# Patient Record
Sex: Female | Born: 1970 | Race: White | Hispanic: No | Marital: Single
Health system: Midwestern US, Community
[De-identification: ages and names within clinical notes are randomized; demographics above are authoritative.]

## PROBLEM LIST (undated history)

## (undated) ENCOUNTER — Emergency Department (HOSPITAL_COMMUNITY): Discharge status: Against Medical Advice | Payer: BC Managed Care – PPO

## (undated) ENCOUNTER — Emergency Department: Disposition: A | Discharge status: Home / Self Care | Payer: PRIVATE HEALTH INSURANCE

## (undated) DIAGNOSIS — M1611 Unilateral primary osteoarthritis, right hip: Secondary | ICD-10-CM

## (undated) DIAGNOSIS — M1612 Unilateral primary osteoarthritis, left hip: Secondary | ICD-10-CM

## (undated) DIAGNOSIS — Z5189 Encounter for other specified aftercare: Secondary | ICD-10-CM

## (undated) DIAGNOSIS — F419 Anxiety disorder, unspecified: Secondary | ICD-10-CM

## (undated) HISTORY — PX: HIP SURGERY: SHX245

## (undated) HISTORY — PX: COSMETIC SURGERY: SHX468

## (undated) HISTORY — PX: GALLBLADDER SURGERY: SHX652

## (undated) HISTORY — PX: LAPAROSCOPIC HYSTERECTOMY: SHX1926

## (undated) HISTORY — PX: BACK SURGERY: SHX140

## (undated) HISTORY — PX: CHOLECYSTECTOMY: SHX55

## (undated) HISTORY — DX: Encounter for other specified aftercare: Z51.89

## (undated) HISTORY — PX: TUBAL LIGATION: SHX77

---

## 2006-10-11 ENCOUNTER — Ambulatory Visit (HOSPITAL_COMMUNITY): Admission: RE | Admit: 2006-10-11 | Discharge: 2006-10-12 | Payer: Self-pay | Admitting: Orthopedic Surgery

## 2006-10-11 ENCOUNTER — Encounter (INDEPENDENT_AMBULATORY_CARE_PROVIDER_SITE_OTHER): Payer: Self-pay | Admitting: Specialist

## 2007-07-05 ENCOUNTER — Other Ambulatory Visit: Admission: RE | Admit: 2007-07-05 | Discharge: 2007-07-05 | Payer: Self-pay | Admitting: Obstetrics and Gynecology

## 2008-06-06 ENCOUNTER — Other Ambulatory Visit: Admission: RE | Admit: 2008-06-06 | Discharge: 2008-06-06 | Payer: Self-pay | Admitting: Obstetrics and Gynecology

## 2010-12-18 NOTE — Op Note (Signed)
NAMECOLETTA, LOCKNER                ACCOUNT NO.:  [id number]   MEDICAL RECORD NO.:  [id number]          PATIENT TYPE:  OIB   LOCATION:  1520                         FACILITY:  [facility]   PHYSICIAN:  Georges Lynch. 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   OPERATION:  Hemilaminectomy and microdiskectomy at L5-S1 on the right.   PROCEDURE:  Under general anesthesia, a routine orthopedic prep and  draping of the lower back was carried out.  The patient had 1 g of IV  Ancef preop.  Two needles were placed in the back for localization  purposes.  An x-ray was taken.  Once we identified the L5-S1 interspace,  an incision was made directly over the space, bleeders identified and  cauterized.  Self-retaining retractors were inserted.  I stripped the  muscle from the lamina and spinous process at L5-S1.  Another x-ray was  taken to verify the position.  We then carried out a hemilaminectomy L5-  S1 in the usual fashion.  Great care was taken to protect the dura.  At  this time, I identified the ligamentum flavum, removed the flavum at the  same time the microscope was being used.  We then inserted some  cottonoids and protected the dura with the D'Errico retractor.  We  cauterized the lateral recess veins.  We did a nice decompression of the  recess.  We identified a large herniated disk.  A cruciate incision was  made in the posterior longitudinal ligament.  I then utilized the  Epstein curettes and nerve hooks to remove the subligamentous disk, and  then we compressed the rest of the disk down in the space and completed  a  microdiskectomy.  Multiple passes into the disk space were carried  out.  I went through a meticulous search for any other subligamentous  disk material.  We utilized the nerve hook and the Epstein curettes and  the hockey-stick to dissect out the subligamentous disk material.  The  root now was extremely free.  We did a nice foraminotomy.  We were able  to easily move the root about as well as the dura.  We thoroughly  irrigated out the area.  I loosely applied some Thrombin-soaked Gelfoam.  The wound then was closed in layers in usual fashion except I left the  deep proximal part of the wound partially left open for drainage  purposes.  I injected 20 mL of 0.5% Marcaine into the wound site.  The  subcu was closed over with 0 Vicryl, the skin with metal staples, and a  sterile Neosporin dressing was applied.  The patient had 1 gram of IV  Ancef preop.  At the end of procedure, she  had 30 mg of Toradol IV.   SURGEON:  Dr. Darrelyn Hillock   ASSISTANT:  Dr. Marlowe Kays           ______________________________  Georges Lynch. Darrelyn Hillock, M.D.     RAG/MEDQ  D:  10/11/2006  T:  10/13/2006  Job:  [id number]

## 2011-02-09 ENCOUNTER — Other Ambulatory Visit (HOSPITAL_COMMUNITY)
Admission: RE | Admit: 2011-02-09 | Discharge: 2011-02-09 | Disposition: A | Discharge status: Home / Self Care | Payer: BC Managed Care – PPO | Source: Ambulatory Visit | Attending: Obstetrics & Gynecology | Admitting: Obstetrics & Gynecology

## 2011-02-09 ENCOUNTER — Other Ambulatory Visit: Payer: Self-pay | Admitting: Obstetrics & Gynecology

## 2011-02-09 DIAGNOSIS — Z01419 Encounter for gynecological examination (general) (routine) without abnormal findings: Secondary | ICD-10-CM | POA: Insufficient documentation

## 2015-12-19 ENCOUNTER — Encounter: Payer: Self-pay | Admitting: Obstetrics and Gynecology

## 2015-12-19 ENCOUNTER — Ambulatory Visit (INDEPENDENT_AMBULATORY_CARE_PROVIDER_SITE_OTHER): Payer: PRIVATE HEALTH INSURANCE | Admitting: Obstetrics and Gynecology

## 2015-12-19 VITALS — BP 110/60 | Ht 68.0 in | Wt 162.0 lb

## 2015-12-19 DIAGNOSIS — N946 Dysmenorrhea, unspecified: Secondary | ICD-10-CM | POA: Diagnosis not present

## 2015-12-19 DIAGNOSIS — N920 Excessive and frequent menstruation with regular cycle: Secondary | ICD-10-CM

## 2015-12-19 DIAGNOSIS — Z01818 Encounter for other preprocedural examination: Secondary | ICD-10-CM

## 2015-12-19 DIAGNOSIS — N938 Other specified abnormal uterine and vaginal bleeding: Secondary | ICD-10-CM

## 2015-12-19 DIAGNOSIS — M545 Low back pain: Secondary | ICD-10-CM

## 2015-12-19 NOTE — Progress Notes (Signed)
Patient ID: Haley Joseph, female   DOB: 01/19/1971, 45 y.o.   MRN: [id number] Pt here today for vaginal bleeding. Pt states that she has been bleeding 17 days. Has had an US and was told she had fibroids and needs an ablation.

## 2015-12-19 NOTE — Patient Instructions (Signed)
TANICA GAIGE  12/19/2015     @PREFPERIOPPHARMACY @   Your procedure is scheduled on  12/23/2015   Report to [facility] at  900  A.M.  Call this number if you have problems the morning of surgery:  [phone]   Remember:  Do not eat food or drink liquids after midnight.  Take these medicines the morning of surgery with A SIP OF WATER  none   Do not wear jewelry, make-up or nail polish.  Do not wear lotions, powders, or perfumes.  You may wear deodorant.  Do not shave 48 hours prior to surgery.  Men may shave face and neck.  Do not bring valuables to the hospital.  [facility] is not responsible for any belongings or valuables.  Contacts, dentures or bridgework may not be worn into surgery.  Leave your suitcase in the car.  After surgery it may be brought to your room.  For patients admitted to the hospital, discharge time will be determined by your treatment team.  Patients discharged the day of surgery will not be allowed to drive home.   Name and phone number of your driver:   family Special instructions:  none  Please read over the following fact sheets that you were given. Coughing and Deep Breathing, Surgical Site Infection Prevention, Anesthesia Post-op Instructions and Care and Recovery After Surgery      Hysteroscopy Hysteroscopy is a procedure used for looking inside the womb (uterus). It may be done for various reasons, including:  To evaluate abnormal bleeding, fibroid (benign, noncancerous) tumors, polyps, scar tissue (adhesions), and possibly cancer of the uterus.  To look for lumps (tumors) and other uterine growths.  To look for causes of why a woman cannot get pregnant (infertility), causes of recurrent loss of pregnancy (miscarriages), or a lost intrauterine device (IUD).  To perform a sterilization by blocking the fallopian tubes from inside the uterus. In this procedure, a thin, flexible tube with a tiny light and camera on  the end of it (hysteroscope) is used to look inside the uterus. A hysteroscopy should be done right after a menstrual period to be sure you are not pregnant. LET [facility] CARE PROVIDER KNOW ABOUT:   Any allergies you have.  All medicines you are taking, including vitamins, herbs, eye drops, creams, and over-the-counter medicines.  Previous problems you or members of your family have had with the use of anesthetics.  Any blood disorders you have.  Previous surgeries you have had.  Medical conditions you have. RISKS AND COMPLICATIONS  Generally, this is a safe procedure. However, as with any procedure, complications can occur. Possible complications include:  Putting a hole in the uterus.  Excessive bleeding.  Infection.  Damage to the cervix.  Injury to other organs.  Allergic reaction to medicines.  Too much fluid used in the uterus for the procedure. BEFORE THE PROCEDURE   Ask your health care provider about changing or stopping any regular medicines.  Do not take aspirin or blood thinners for 1 week before the procedure, or as directed by your health care provider. These can cause bleeding.  If you smoke, do not smoke for 2 weeks before the procedure.  In some cases, a medicine is placed in the cervix the day before the procedure. This medicine makes the cervix have a larger opening (dilate). This makes it easier for the instrument to be inserted into the uterus  during the procedure.  Do not eat or drink anything for at least 8 hours before the surgery.  Arrange for someone to take you home after the procedure. PROCEDURE   You may be given a medicine to relax you (sedative). You may also be given one of the following:  A medicine that numbs the area around the cervix (local anesthetic).  A medicine that makes you sleep through the procedure (general anesthetic).  The hysteroscope is inserted through the vagina into the uterus. The camera on the hysteroscope  sends a picture to a TV screen. This gives the surgeon a good view inside the uterus.  During the procedure, air or a liquid is put into the uterus, which allows the surgeon to see better.  Sometimes, tissue is gently scraped from inside the uterus. These tissue samples are sent to a lab for testing. AFTER THE PROCEDURE   If you had a general anesthetic, you may be groggy for a couple hours after the procedure.  If you had a local anesthetic, you will be able to go home as soon as you are stable and feel ready.  You may have some cramping. This normally lasts for a couple days.  You may have bleeding, which varies from light spotting for a few days to menstrual-like bleeding for 3-7 days. This is normal.  If your test results are not back during the visit, make an appointment with your health care provider to find out the results.   This information is not intended to replace advice given to you by your health care provider. Make sure you discuss any questions you have with your health care provider.   Document Released: 10/25/2000 Document Revised: 05/09/2013 Document Reviewed: 02/15/2013 Elsevier Interactive Patient Education 2016 [employer] Hysteroscopy, Care After Refer to this sheet in the next few weeks. These instructions provide you with information on caring for yourself after your procedure. Your health care provider may also give you more specific instructions. Your treatment has been planned according to current medical practices, but problems sometimes occur. Call your health care provider if you have any problems or questions after your procedure.  WHAT TO EXPECT AFTER THE PROCEDURE After your procedure, it is typical to have the following:  You may have some cramping. This normally lasts for a couple days.  You may have bleeding. This can vary from light spotting for a few days to menstrual-like bleeding for 3-7 days. HOME CARE INSTRUCTIONS  Rest for the first 1-2  days after the procedure.  Only take over-the-counter or prescription medicines as directed by your health care provider. Do not take aspirin. It can increase the chances of bleeding.  Take showers instead of baths for 2 weeks or as directed by your health care provider.  Do not drive for 24 hours or as directed.  Do not drink alcohol while taking pain medicine.  Do not use tampons, douche, or have sexual intercourse for 2 weeks or until your health care provider says it is okay.  Take your temperature twice a day for 4-5 days. Write it down each time.  Follow your health care provider's advice about diet, exercise, and lifting.  If you develop constipation, you may:  Take a mild laxative if your health care provider approves.  Add bran foods to your diet.  Drink enough fluids to keep your urine clear or pale yellow.  Try to have someone with you or available to you for the first 24-48 hours, especially if you  were given a general anesthetic.  Follow up with your health care provider as directed. SEEK MEDICAL CARE IF:  You feel dizzy or lightheaded.  You feel sick to your stomach (nauseous).  You have abnormal vaginal discharge.  You have a rash.  You have pain that is not controlled with medicine. SEEK IMMEDIATE MEDICAL CARE IF:  You have bleeding that is heavier than a normal menstrual period.  You have a fever.  You have increasing cramps or pain, not controlled with medicine.  You have new belly (abdominal) pain.  You pass out.  You have pain in the tops of your shoulders (shoulder strap areas).  You have shortness of breath.   This information is not intended to replace advice given to you by your health care provider. Make sure you discuss any questions you have with your health care provider.   Document Released: 05/09/2013 Document Reviewed: 05/09/2013 Elsevier Interactive Patient Education 2016 [employer]. Dilation and Curettage or Vacuum  Curettage Dilation and curettage (D&C) and vacuum curettage are minor procedures. A D&C involves stretching (dilation) the cervix and scraping (curettage) the inside lining of the womb (uterus). During a D&C, tissue is gently scraped from the inside lining of the uterus. During a vacuum curettage, the lining and tissue in the uterus are removed with the use of gentle suction.  Curettage may be performed to either diagnose or treat a problem. As a diagnostic procedure, curettage is performed to examine tissues from the uterus. A diagnostic curettage may be performed for the following symptoms:   Irregular bleeding in the uterus.   Bleeding with the development of clots.   Spotting between menstrual periods.   Prolonged menstrual periods.   Bleeding after menopause.   No menstrual period (amenorrhea).   A change in size and shape of the uterus.  As a treatment procedure, curettage may be performed for the following reasons:   Removal of an IUD (intrauterine device).   Removal of retained placenta after giving birth. Retained placenta can cause an infection or bleeding severe enough to require transfusions.   Abortion.   Miscarriage.   Removal of polyps inside the uterus.   Removal of uncommon types of noncancerous lumps (fibroids).  [facility] KNOW ABOUT:   Any allergies you have.   All medicines you are taking, including vitamins, herbs, eye drops, creams, and over-the-counter medicines.   Previous problems you or members of your family have had with the use of anesthetics.   Any blood disorders you have.   Previous surgeries you have had.   Medical conditions you have. RISKS AND COMPLICATIONS  Generally, this is a safe procedure. However, as with any procedure, complications can occur. Possible complications include:  Excessive bleeding.   Infection of the uterus.   Damage to the cervix.   Development of scar tissue  (adhesions) inside the uterus, later causing abnormal amounts of menstrual bleeding.   Complications from the general anesthetic, if a general anesthetic is used.   Putting a hole (perforation) in the uterus. This is rare.  BEFORE THE PROCEDURE   Eat and drink before the procedure only as directed by your health care provider.   Arrange for someone to take you home.  PROCEDURE  This procedure usually takes about 15-30 minutes.  You will be given one of the following:  A medicine that numbs the area in and around the cervix (local anesthetic).   A medicine to make you sleep through the procedure (general  anesthetic).  You will lie on your back with your legs in stirrups.   A warm metal or plastic instrument (speculum) will be placed in your vagina to keep it open and to allow the health care provider to see the cervix.  There are two ways in which your cervix can be softened and dilated. These include:   Taking a medicine.   Having thin rods (laminaria) inserted into your cervix.   A curved tool (curette) will be used to scrape cells from the inside lining of the uterus. In some cases, gentle suction is applied with the curette. The curette will then be removed.  AFTER THE PROCEDURE   You will rest in the recovery area until you are stable and are ready to go home.   You may feel sick to your stomach (nauseous) or throw up (vomit) if you were given a general anesthetic.   You may have a sore throat if a tube was placed in your throat during general anesthesia.   You may have light cramping and bleeding. This may last for 2 days to 2 weeks after the procedure.   Your uterus needs to make a new lining after the procedure. This may make your next period late.   This information is not intended to replace advice given to you by your health care provider. Make sure you discuss any questions you have with your health care provider.   Document Released: 07/19/2005  Document Revised: 03/21/2013 Document Reviewed: 02/15/2013 Elsevier Interactive Patient Education 2016 [employer]. Dilation and Curettage or Vacuum Curettage, Care After These instructions give you information on caring for yourself after your procedure. Your doctor may also give you more specific instructions. Call your doctor if you have any problems or questions after your procedure. HOME CARE  Do not drive for 24 hours.  Wait 1 week before doing any activities that wear you out.  Take your temperature 2 times a day for 4 days. Write it down. Tell your doctor if you have a fever.  Do not stand for a long time.  Do not lift, push, or pull anything over 10 pounds (4.5 kilograms).  Limit stair climbing to once or twice a day.  Rest often.  Continue with your usual diet.  Drink enough fluids to keep your pee (urine) clear or pale yellow.  If you have a hard time pooping (constipation), you may:  Take a medicine to help you go poop (laxative) as told by your doctor.  Eat more fruit and bran.  Drink more fluids.  Take showers, not baths, for as long as told by your doctor.  Do not swim or use a hot tub until your doctor says it is okay.  Have someone with you for 1-2 days after the procedure.  Do not douche, use tampons, or have sex (intercourse) for 2 weeks.  Only take medicines as told by your doctor. Do not take aspirin. It can cause bleeding.  Keep all doctor visits. GET HELP IF:  You have cramps or pain not helped by medicine.  You have new pain in the belly (abdomen).  You have a bad smelling fluid coming from your vagina.  You have a rash.  You have problems with any medicine. GET HELP RIGHT AWAY IF:   You start to bleed more than a regular period.  You have a fever.  You have chest pain.  You have trouble breathing.  You feel dizzy or feel like passing out (fainting).  You  pass out.  You have pain in the tops of your shoulders.  You have  vaginal bleeding with or without clumps of blood (blood clots). MAKE SURE YOU:  Understand these instructions.  Will watch your condition.  Will get help right away if you are not doing well or get worse.   This information is not intended to replace advice given to you by your health care provider. Make sure you discuss any questions you have with your health care provider.   Document Released: 04/27/2008 Document Revised: 07/24/2013 Document Reviewed: 02/15/2013 Elsevier Interactive Patient Education 2016 [employer]. Endometrial Ablation Endometrial ablation removes the lining of the uterus (endometrium). It is usually a same-day, outpatient treatment. Ablation helps avoid major surgery, such as surgery to remove the cervix and uterus (hysterectomy). After endometrial ablation, you will have little or no menstrual bleeding and may not be able to have children. However, if you are premenopausal, you will need to use a reliable method of birth control following the procedure because of the small chance that pregnancy can occur. There are different reasons to have this procedure. These reasons include:  Heavy periods.  Bleeding that is causing anemia.  Irregular bleeding.  Bleeding fibroids on the lining inside the uterus if they are smaller than 3 centimeters. This procedure may not be possible for you if:   You want to have children in the future.   You have severe cramps with your menstrual period.   You have precancerous or cancerous cells in your uterus.   You were recently pregnant.   You have gone through menopause.   You have had major surgery on your uterus, resulting in thinning of the uterine wall. Surgeries may include:  The removal of one or more uterine fibroids (myomectomy).  A cesarean section with a classic (vertical) incision on your uterus. Ask your health care provider what type of cesarean you had. Sometimes the scar on your skin is different than  the scar on your uterus. Even if you have had surgery on your uterus, certain types of ablation may still be safe for you. Talk with your health care provider. LET [facility] CARE PROVIDER KNOW ABOUT:  Any allergies you have.  All medicines you are taking, including vitamins, herbs, eye drops, creams, and over-the-counter medicines.  Previous problems you or members of your family have had with the use of anesthetics.  Any blood disorders you have.  Previous surgeries you have had.  Medical conditions you have. RISKS AND COMPLICATIONS  Generally, this is a safe procedure. However, as with any procedure, complications can occur. Possible complications include:  Perforation of the uterus.  Bleeding.  Infection of the uterus, bladder, or vagina.  Injury to surrounding organs.  An air bubble to the lung (air embolus).  Pregnancy following the procedure.  Failure of the procedure to help the problem, requiring hysterectomy.  Decreased ability to diagnose cancer in the lining of the uterus. BEFORE THE PROCEDURE  The lining of the uterus must be tested to make sure there is no pre-cancerous or cancer cells present.  An ultrasound may be performed to look at the size of the uterus and to check for abnormalities.  Medicines may be given to thin the lining of the uterus. PROCEDURE  During the procedure, your health care provider will use a tool called a resectoscope to help see inside your uterus. There are different ways to remove the lining of your uterus.   Radiofrequency - This method uses a  radiofrequency-alternating electric current to remove the lining of the uterus.  Cryotherapy - This method uses extreme cold to freeze the lining of the uterus.  Heated-Free Liquid - This method uses heated salt (saline) solution to remove the lining of the uterus.  Microwave - This method uses high-energy microwaves to heat up the lining of the uterus to remove it.  Thermal balloon  - This method involves inserting a catheter with a balloon tip into the uterus. The balloon tip is filled with heated fluid to remove the lining of the uterus. AFTER THE PROCEDURE  After your procedure, do not have sexual intercourse or insert anything into your vagina until permitted by your health care provider. After the procedure, you may experience:  Cramps.  Vaginal discharge.  Frequent urination.   This information is not intended to replace advice given to you by your health care provider. Make sure you discuss any questions you have with your health care provider.   Document Released: 05/28/2004 Document Revised: 04/09/2015 Document Reviewed: 12/20/2012 Elsevier Interactive Patient Education 2016 [employer]. PATIENT INSTRUCTIONS POST-ANESTHESIA  IMMEDIATELY FOLLOWING SURGERY:  Do not drive or operate machinery for the first twenty four hours after surgery.  Do not make any important decisions for twenty four hours after surgery or while taking narcotic pain medications or sedatives.  If you develop intractable nausea and vomiting or a severe headache please notify your doctor immediately.  FOLLOW-UP:  Please make an appointment with your surgeon as instructed. You do not need to follow up with anesthesia unless specifically instructed to do so.  WOUND CARE INSTRUCTIONS (if applicable):  Keep a dry clean dressing on the anesthesia/puncture wound site if there is drainage.  Once the wound has quit draining you may leave it open to air.  Generally you should leave the bandage intact for twenty four hours unless there is drainage.  If the epidural site drains for more than 36-48 hours please call the anesthesia department.  QUESTIONS?:  Please feel free to call your physician or the hospital operator if you have any questions, and they will be happy to assist you.

## 2015-12-19 NOTE — Progress Notes (Signed)
Patient ID: Haley Citronngela M Roppolo, female   DOB: 06/07/1971, 45 y.o.   MRN: [id number]   [facility]  @DATE @            Patient name: Haley Citronngela M Gianfrancesco MRN [id number]  Date of birth: 04/27/1971  CC & HPI:  Haley Joseph is a 45 y.o. female presenting today for menorrhagia and dysmenorrhea onset 17 days ago. Pt states her periods have been accompanied by intense back pain and heavy bleeding since back injury in 08/2015. Per pt, she also has back pain when not on her period as a result of the recent injury, and reports this pain is significantly worsened during and prior to menses. Per pt, she does not have a frequent h/o back pain and is normally active. Pt states her current episode of bleeding is significantly heavier, includes clots and is worsened when she stands or ambulates. She reports back pain and pelvic cramping is alleviated with standing or ambulating. She states her back pain has affected her ability to run and exercise as normal. Pt had [place] in [place] 3 days ago showing uterine fibroids. US also showed uterine volume and size WNL, endometrium 0.75 cm. She was started on 10 mcg Lo Loestrin FE and was advised that Ablation may be indicated. Pt no longer smokes cigarettes, but uses a tobacco vaporizer pen. No h/o asthma.   ROS:  Review of Systems  Genitourinary:       +menorrhagia, dysmenorrhea  Musculoskeletal: Positive for back pain (low).    Pertinent History Reviewed:   Reviewed: Significant for bilateral tubal ligation  Medical        History reviewed. No pertinent past medical history.                            Surgical Hx:    Past Surgical History  Procedure Laterality Date  . Tubal ligation    . Cholecystectomy    . Back surgery     Medications: Reviewed & Updated - see associated section                       Current outpatient prescriptions:  .  alprazolam (XANAX) 2 MG tablet, Take 2 mg by mouth at bedtime as needed for sleep., Disp: , Rfl:    Social  History: Reviewed -  reports that she has quit smoking. She has never used smokeless tobacco.  Objective Findings:  Vitals: Blood pressure 110/60, height 5\' 8"  (1.727 m), weight 162 lb (73.483 kg), last menstrual period 12/02/2015.  Physical Examination: General appearance - alert, well appearing, and in no distress Heart- RRR, no murmurs, rubs, gallops  Lungs- Lungs CTA bilaterally. No wheezes, rales or rhonchi HENT- mucous membranes moist, no oropharyngeal edema or erythema, no exudate or tonsillar swelling. Airway patent.  Abdomen - soft, nontender, nondistended, no masses or organomegaly Pelvic - normal external genitalia, vulva, vagina, cervix, uterus and adnexa,  VULVA: normal appearing vulva with no masses, tenderness or lesions,  VAGINA: normal appearing vagina with normal color and discharge, no lesions,  CERVIX: normal appearing cervix without discharge or lesions, stenotic, small  UTERUS: uterus is normal size, shape, consistency, small, no nodularity, anteflexed, 85g per US   ADNEXA: normal adnexa in size, nontender and no masses Back exam - full range of motion, no tenderness, palpable spasm or pain on motion Musculoskeletal - no joint tenderness, deformity or swelling Extremities - peripheral  pulses normal, no pedal edema, no clubbing or cyanosis Skin - normal coloration and turgor, no rashes, no suspicious skin lesions noted  Discussed with pt risks and benefits of endometrial ablation vs hysterectomy. At end of discussion, pt had opportunity to ask questions and has no further questions at this time. Pt desires to go forward with endometrial ablation.   Greater than 50% was spent in counseling and coordination of care with the patient. Total time greater than: 25 minutes   Assessment & Plan:   A:  1. Menorrhagia and dysmenorrhea for 17 days  2. Increased lower back pain prior to and during menses s/p injury in 08/2015  3. DUB 4. GC/CHL collected   P:  1. Schedule CBC,  CMP, UA on 12/22/15 and Novasure endometrial ablation with biopsy on 12/23/15  2. D/c Lo Loestrin     By signing my name below, I, Doreatha Martin, attest that this documentation has been prepared under the direction and in the presence of Tilda Burrow, MD. Electronically Signed: Doreatha Martin, ED Scribe. 12/19/2015. 10:18 AM.  I personally performed the services described in this documentation, which was SCRIBED in my presence. The recorded information has been reviewed and considered accurate. It has been edited as necessary during review. Tilda Burrow, MD

## 2015-12-22 ENCOUNTER — Encounter (HOSPITAL_COMMUNITY)
Admission: RE | Admit: 2015-12-22 | Discharge: 2015-12-22 | Disposition: A | Discharge status: Home / Self Care | Payer: 59 | Source: Ambulatory Visit | Attending: Obstetrics and Gynecology | Admitting: Obstetrics and Gynecology

## 2015-12-22 ENCOUNTER — Other Ambulatory Visit: Payer: Self-pay | Admitting: Obstetrics and Gynecology

## 2015-12-22 ENCOUNTER — Encounter (HOSPITAL_COMMUNITY): Payer: Self-pay

## 2015-12-22 DIAGNOSIS — Z87891 Personal history of nicotine dependence: Secondary | ICD-10-CM | POA: Diagnosis not present

## 2015-12-22 DIAGNOSIS — Z79899 Other long term (current) drug therapy: Secondary | ICD-10-CM | POA: Diagnosis not present

## 2015-12-22 DIAGNOSIS — Z01812 Encounter for preprocedural laboratory examination: Secondary | ICD-10-CM | POA: Diagnosis not present

## 2015-12-22 DIAGNOSIS — N920 Excessive and frequent menstruation with regular cycle: Secondary | ICD-10-CM | POA: Diagnosis not present

## 2015-12-22 DIAGNOSIS — F419 Anxiety disorder, unspecified: Secondary | ICD-10-CM | POA: Diagnosis not present

## 2015-12-22 DIAGNOSIS — N946 Dysmenorrhea, unspecified: Secondary | ICD-10-CM | POA: Diagnosis not present

## 2015-12-22 HISTORY — DX: Anxiety disorder, unspecified: F41.9

## 2015-12-22 LAB — CBC
HEMATOCRIT: 37.6 % (ref 36.0–46.0)
HEMOGLOBIN: 12 g/dL (ref 12.0–15.0)
MCH: 25.9 pg — ABNORMAL LOW (ref 26.0–34.0)
MCHC: 31.9 g/dL (ref 30.0–36.0)
MCV: 81.2 fL (ref 78.0–100.0)
Platelets: 245 10*3/uL (ref 150–400)
RBC: 4.63 MIL/uL (ref 3.87–5.11)
RDW: 14.9 % (ref 11.5–15.5)
WBC: 5 10*3/uL (ref 4.0–10.5)

## 2015-12-22 LAB — COMPREHENSIVE METABOLIC PANEL
ALBUMIN: 3.8 g/dL (ref 3.5–5.0)
ALT: 18 U/L (ref 14–54)
AST: 39 U/L (ref 15–41)
Alkaline Phosphatase: 59 U/L (ref 38–126)
Anion gap: 6 (ref 5–15)
BILIRUBIN TOTAL: 0.7 mg/dL (ref 0.3–1.2)
BUN: 10 mg/dL (ref 6–20)
CHLORIDE: 105 mmol/L (ref 101–111)
CO2: 26 mmol/L (ref 22–32)
CREATININE: 0.67 mg/dL (ref 0.44–1.00)
Calcium: 9.1 mg/dL (ref 8.9–10.3)
GFR calc Af Amer: >60 mL/min (ref >60)
Glucose, Bld: 114 mg/dL — ABNORMAL HIGH (ref 65–99)
Potassium: 3.9 mmol/L (ref 3.5–5.1)
Sodium: 137 mmol/L (ref 135–145)
Total Protein: 6.7 g/dL (ref 6.5–8.1)

## 2015-12-22 LAB — URINALYSIS, ROUTINE W REFLEX MICROSCOPIC
Bilirubin Urine: NEGATIVE
GLUCOSE, UA: NEGATIVE mg/dL
Leukocytes, UA: NEGATIVE
Nitrite: NEGATIVE
PH: 6.5 (ref 5.0–8.0)
Protein, ur: 30 mg/dL — AB
SPECIFIC GRAVITY, URINE: 1.01 (ref 1.005–1.030)

## 2015-12-22 LAB — URINE MICROSCOPIC-ADD ON

## 2015-12-22 LAB — HCG, SERUM, QUALITATIVE: Preg, Serum: NEGATIVE

## 2015-12-22 NOTE — H&P (Signed)
Progress Notes    Expand All Collapse All   Patient ID: Haley Joseph, female DOB: 05/02/1971, 44 y.o. MRN: [id number]  [facility] Visit  @DATE@ Patient name: Haley M MckinnyMRN [id number] Date of birth: 06/04/1971  CC & HPI:  Haley Joseph is a 44 y.o. female presenting today for menorrhagia and dysmenorrhea onset 17 days ago. Pt states her periods have been accompanied by intense back pain and heavy bleeding since back injury in 08/2015. Per pt, she also has back pain when not on her period as a result of the recent injury, and reports this pain is significantly worsened during and prior to menses. Per pt, she does not have a frequent h/o back pain and is normally active. Pt states her current episode of bleeding is significantly heavier, includes clots and is worsened when she stands or ambulates. She reports back pain and pelvic cramping is alleviated with standing or ambulating. She states her back pain has affected her ability to run and exercise as normal. Pt had pelvic US in [place] 3 days ago showing uterine fibroids. US also showed uterine volume and size WNL, endometrium 0.75 cm. She was started on 10 mcg Lo Loestrin FE and was advised that Ablation may be indicated. Pt no longer smokes cigarettes, but uses a tobacco vaporizer pen. No h/o asthma.   ROS:  Review of Systems  Genitourinary:   +menorrhagia, dysmenorrhea  Musculoskeletal: Positive for back pain (low).    Pertinent History Reviewed:  Reviewed: Significant for bilateral tubal ligation  Medical History reviewed. No pertinent past medical history.  Surgical Hx:  Past Surgical History  Procedure Laterality Date  . Tubal ligation    . Cholecystectomy    . Back surgery     Medications: Reviewed & Updated - see associated section   Current outpatient prescriptions:  . alprazolam (XANAX) 2 MG  tablet, Take 2 mg by mouth at bedtime as needed for sleep., Disp: , Rfl:    Social History: Reviewed -  reports that she has quit smoking. She has never used smokeless tobacco.  Objective Findings:  Vitals: Blood pressure 110/60, height 5' 8" (1.727 m), weight 162 lb (73.483 kg), last menstrual period 12/02/2015.  Physical Examination: General appearance - alert, well appearing, and in no distress Heart- RRR, no murmurs, rubs, gallops  Lungs- Lungs CTA bilaterally. No wheezes, rales or rhonchi HENT- mucous membranes moist, no oropharyngeal edema or erythema, no exudate or tonsillar swelling. Airway patent.  Abdomen - soft, nontender, nondistended, no masses or organomegaly Pelvic - normal external genitalia, vulva, vagina, cervix, uterus and adnexa,  VULVA: normal appearing vulva with no masses, tenderness or lesions,  VAGINA: normal appearing vagina with normal color and discharge, no lesions,  CERVIX: normal appearing cervix without discharge or lesions, stenotic, small  UTERUS: uterus is normal size, shape, consistency, small, no nodularity, anteflexed, 85g per US  ADNEXA: normal adnexa in size, nontender and no masses Back exam - full range of motion, no tenderness, palpable spasm or pain on motion Musculoskeletal - no joint tenderness, deformity or swelling Extremities - peripheral pulses normal, no pedal edema, no clubbing or cyanosis Skin - normal coloration and turgor, no rashes, no suspicious skin lesions noted  Discussed with pt risks and benefits of endometrial ablation vs hysterectomy. At end of discussion, pt had opportunity to ask questions and has no further questions at this time. Pt desires to go forward with endometrial ablation.   Greater than 50% was spent in counseling   and coordination of care with the patient. Total time greater than: 25 minutes   Assessment & Plan:   A:  1. Menorrhagia and dysmenorrhea for 17 days  2. Increased lower back pain  prior to and during menses s/p injury in 08/2015  3. DUB 4. GC/CHL collected   P:  1. Schedule CBC, CMP, UA on 12/22/15 and Novasure endometrial ablation with biopsy on 12/23/15  2. D/c Lo Loestrin     By signing my name below, I, Doreatha MartinEva Mathews, attest that this documentation has been prepared under the direction and in the presence of Tilda BurrowJohn Mccartney Chuba V, MD. Electronically Signed: Doreatha MartinEva Mathews, ED Scribe. 12/19/2015. 10:18 AM.      I personally performed the services described in this documentation, which was SCRIBED in my presence. The recorded information has been reviewed and considered accurate. It has been edited as necessary during review. Tilda BurrowFERGUSON,Celise Bazar V, MD

## 2015-12-22 NOTE — Pre-Procedure Instructions (Signed)
Pt given information for MyChart to review and sign up from home.

## 2015-12-23 ENCOUNTER — Ambulatory Visit (HOSPITAL_COMMUNITY): Payer: 59 | Admitting: Anesthesiology

## 2015-12-23 ENCOUNTER — Ambulatory Visit (HOSPITAL_COMMUNITY)
Admission: RE | Admit: 2015-12-23 | Discharge: 2015-12-23 | Disposition: A | Discharge status: Home / Self Care | Payer: 59 | Source: Ambulatory Visit | Attending: Obstetrics and Gynecology | Admitting: Obstetrics and Gynecology

## 2015-12-23 ENCOUNTER — Encounter (HOSPITAL_COMMUNITY): Payer: Self-pay | Admitting: *Deleted

## 2015-12-23 ENCOUNTER — Encounter (HOSPITAL_COMMUNITY)
Admission: RE | Disposition: A | Discharge status: Home / Self Care | Payer: Self-pay | Source: Ambulatory Visit | Attending: Obstetrics and Gynecology

## 2015-12-23 DIAGNOSIS — Z87891 Personal history of nicotine dependence: Secondary | ICD-10-CM | POA: Insufficient documentation

## 2015-12-23 DIAGNOSIS — N920 Excessive and frequent menstruation with regular cycle: Secondary | ICD-10-CM | POA: Insufficient documentation

## 2015-12-23 DIAGNOSIS — N946 Dysmenorrhea, unspecified: Secondary | ICD-10-CM | POA: Insufficient documentation

## 2015-12-23 DIAGNOSIS — F419 Anxiety disorder, unspecified: Secondary | ICD-10-CM | POA: Insufficient documentation

## 2015-12-23 DIAGNOSIS — Z79899 Other long term (current) drug therapy: Secondary | ICD-10-CM | POA: Insufficient documentation

## 2015-12-23 DIAGNOSIS — N938 Other specified abnormal uterine and vaginal bleeding: Secondary | ICD-10-CM | POA: Diagnosis not present

## 2015-12-23 DIAGNOSIS — Z01812 Encounter for preprocedural laboratory examination: Secondary | ICD-10-CM | POA: Insufficient documentation

## 2015-12-23 HISTORY — PX: DILITATION & CURRETTAGE/HYSTROSCOPY WITH NOVASURE ABLATION: SHX5568

## 2015-12-23 LAB — GC/CHLAMYDIA PROBE AMP
Chlamydia trachomatis, NAA: NEGATIVE
Neisseria gonorrhoeae by PCR: NEGATIVE

## 2015-12-23 SURGERY — DILATATION & CURETTAGE/HYSTEROSCOPY WITH NOVASURE ABLATION
Anesthesia: General

## 2015-12-23 MED ORDER — KETOROLAC TROMETHAMINE 10 MG PO TABS: 10.0000 mg | ORAL_TABLET | Freq: Four times a day (QID) | ORAL | Status: DC | PRN

## 2015-12-23 MED ORDER — LACTATED RINGERS IV SOLN
INTRAVENOUS | Status: DC
  Administered 2015-12-23: 09:00:00 via INTRAVENOUS

## 2015-12-23 MED ORDER — PROPOFOL 10 MG/ML IV BOLUS
INTRAVENOUS | Status: DC | PRN
  Administered 2015-12-23: 150 mg via INTRAVENOUS

## 2015-12-23 MED ORDER — MIDAZOLAM HCL 2 MG/2ML IJ SOLN
INTRAMUSCULAR | Status: AC
Start: 2015-12-23 — End: 2015-12-23
  Filled 2015-12-23: qty 2

## 2015-12-23 MED ORDER — BUPIVACAINE-EPINEPHRINE (PF) 0.5% -1:200000 IJ SOLN
INTRAMUSCULAR | Status: AC
  Filled 2015-12-23: qty 30

## 2015-12-23 MED ORDER — 0.9 % SODIUM CHLORIDE (POUR BTL) OPTIME
TOPICAL | Status: DC | PRN
  Administered 2015-12-23: 1000 mL

## 2015-12-23 MED ORDER — SODIUM CHLORIDE 0.9 % IR SOLN
Status: DC | PRN
  Administered 2015-12-23: 3000 mL

## 2015-12-23 MED ORDER — FENTANYL CITRATE (PF) 100 MCG/2ML IJ SOLN
INTRAMUSCULAR | Status: DC | PRN
  Administered 2015-12-23 (×2): 50 ug via INTRAVENOUS

## 2015-12-23 MED ORDER — FENTANYL CITRATE (PF) 100 MCG/2ML IJ SOLN
25.0000 ug | INTRAMUSCULAR | Status: AC
  Administered 2015-12-23 (×2): 25 ug via INTRAVENOUS

## 2015-12-23 MED ORDER — ONDANSETRON HCL 4 MG/2ML IJ SOLN
4.0000 mg | Freq: Once | INTRAMUSCULAR | Status: AC
  Administered 2015-12-23: 4 mg via INTRAVENOUS

## 2015-12-23 MED ORDER — OXYCODONE-ACETAMINOPHEN 5-325 MG PO TABS: 1.0000 | ORAL_TABLET | ORAL | Status: DC | PRN

## 2015-12-23 MED ORDER — FENTANYL CITRATE (PF) 100 MCG/2ML IJ SOLN
INTRAMUSCULAR | Status: AC
Start: 2015-12-23 — End: 2015-12-23
  Filled 2015-12-23: qty 2

## 2015-12-23 MED ORDER — ONDANSETRON HCL 4 MG/2ML IJ SOLN
INTRAMUSCULAR | Status: AC
  Filled 2015-12-23: qty 2

## 2015-12-23 MED ORDER — PROPOFOL 10 MG/ML IV BOLUS
INTRAVENOUS | Status: AC
Start: 2015-12-23 — End: 2015-12-23
  Filled 2015-12-23: qty 20

## 2015-12-23 MED ORDER — FENTANYL CITRATE (PF) 100 MCG/2ML IJ SOLN
25.0000 ug | INTRAMUSCULAR | Status: DC | PRN
  Administered 2015-12-23 (×2): 50 ug via INTRAVENOUS

## 2015-12-23 MED ORDER — MIDAZOLAM HCL 5 MG/5ML IJ SOLN
INTRAMUSCULAR | Status: DC | PRN
  Administered 2015-12-23: 1 mg via INTRAVENOUS

## 2015-12-23 MED ORDER — BUPIVACAINE HCL (PF) 0.5 % IJ SOLN
INTRAMUSCULAR | Status: AC
  Filled 2015-12-23: qty 30

## 2015-12-23 MED ORDER — ONDANSETRON HCL 4 MG/2ML IJ SOLN
4.0000 mg | Freq: Once | INTRAMUSCULAR | Status: AC | PRN
  Administered 2015-12-23: 4 mg via INTRAVENOUS
  Filled 2015-12-23: qty 2

## 2015-12-23 MED ORDER — LIDOCAINE HCL (CARDIAC) 20 MG/ML IV SOLN
INTRAVENOUS | Status: DC | PRN
  Administered 2015-12-23: 40 mg via INTRATRACHEAL

## 2015-12-23 MED ORDER — FENTANYL CITRATE (PF) 100 MCG/2ML IJ SOLN
INTRAMUSCULAR | Status: AC
  Filled 2015-12-23: qty 2

## 2015-12-23 MED ORDER — LIDOCAINE HCL (PF) 1 % IJ SOLN
INTRAMUSCULAR | Status: AC
  Filled 2015-12-23: qty 5

## 2015-12-23 MED ORDER — BUPIVACAINE HCL (PF) 0.5 % IJ SOLN
INTRAMUSCULAR | Status: DC | PRN
  Administered 2015-12-23: 20 mL

## 2015-12-23 MED ORDER — MIDAZOLAM HCL 2 MG/2ML IJ SOLN
1.0000 mg | INTRAMUSCULAR | Status: DC | PRN
  Administered 2015-12-23 (×2): 2 mg via INTRAVENOUS

## 2015-12-23 SURGICAL SUPPLY — 27 items
ABLATOR ENDOMETRIAL BIPOLAR (ABLATOR) ×2 IMPLANT
BAG HAMPER (MISCELLANEOUS) ×2 IMPLANT
CATH ROBINSON RED A/P 16FR (CATHETERS) ×2 IMPLANT
CLOTH BEACON ORANGE TIMEOUT ST (SAFETY) ×2 IMPLANT
COVER LIGHT HANDLE STERIS (MISCELLANEOUS) ×4 IMPLANT
DECANTER SPIKE VIAL GLASS SM (MISCELLANEOUS) ×2 IMPLANT
GLOVE BIOGEL PI IND STRL 7.0 (GLOVE) ×1 IMPLANT
GLOVE BIOGEL PI IND STRL 9 (GLOVE) ×1 IMPLANT
GLOVE BIOGEL PI INDICATOR 7.0 (GLOVE) ×1
GLOVE BIOGEL PI INDICATOR 9 (GLOVE) ×1
GLOVE ECLIPSE 6.5 STRL STRAW (GLOVE) ×1 IMPLANT
GLOVE ECLIPSE 9.0 STRL (GLOVE) ×2 IMPLANT
GLOVE EXAM NITRILE MD LF STRL (GLOVE) ×1 IMPLANT
GOWN SPEC L3 XXLG W/TWL (GOWN DISPOSABLE) ×2 IMPLANT
GOWN STRL REUS W/TWL LRG LVL3 (GOWN DISPOSABLE) ×2 IMPLANT
INST SET HYSTEROSCOPY (KITS) ×2 IMPLANT
IV NS IRRIG 3000ML ARTHROMATIC (IV SOLUTION) ×2 IMPLANT
KIT ROOM TURNOVER AP CYSTO (KITS) ×2 IMPLANT
KIT ROOM TURNOVER APOR (KITS) ×2 IMPLANT
MANIFOLD NEPTUNE II (INSTRUMENTS) ×2 IMPLANT
NS IRRIG 1000ML POUR BTL (IV SOLUTION) ×2 IMPLANT
PACK PERI GYN (CUSTOM PROCEDURE TRAY) ×2 IMPLANT
PAD ARMBOARD 7.5X6 YLW CONV (MISCELLANEOUS) ×2 IMPLANT
PAD TELFA 3X4 1S STER (GAUZE/BANDAGES/DRESSINGS) ×2 IMPLANT
SET BASIN LINEN APH (SET/KITS/TRAYS/PACK) ×2 IMPLANT
SET IRRIG Y TYPE TUR BLADDER L (SET/KITS/TRAYS/PACK) ×2 IMPLANT
SYR CONTROL 10ML LL (SYRINGE) ×2 IMPLANT

## 2015-12-23 NOTE — H&P (View-Only) (Signed)
Progress Notes    Expand All Collapse All   Patient ID: Haley Joseph, female DOB: [birth year], 45 y.o. MRN: [id number]  [facility] Visit  @ Patient name: Haley Ballinas AllenMRN [id number] Date of birth: 09-08-1970  CC & HPI:  Haley Joseph is a 45 y.o. female presenting today for menorrhagia and dysmenorrhea onset 17 days ago. Pt states her periods have been accompanied by intense back pain and heavy bleeding since back injury in 08/2015. Per pt, she also has back pain when not on her period as a result of the recent injury, and reports this pain is significantly worsened during and prior to menses. Per pt, she does not have a frequent h/o back pain and is normally active. Pt states her current episode of bleeding is significantly heavier, includes clots and is worsened when she stands or ambulates. She reports back pain and pelvic cramping is alleviated with standing or ambulating. She states her back pain has affected her ability to run and exercise as normal. Pt had pelvic US in [place] 3 days ago showing uterine fibroids. [place] also showed uterine volume and size WNL, endometrium 0.75 cm. She was started on 10 mcg Lo Loestrin FE and was advised that Ablation may be indicated. Pt no longer smokes cigarettes, but uses a tobacco vaporizer pen. No h/o asthma.   ROS:  Review of Systems  Genitourinary:   +menorrhagia, dysmenorrhea  Musculoskeletal: Positive for back pain (low).    Pertinent History Reviewed:  Reviewed: Significant for bilateral tubal ligation  Medical History reviewed. No pertinent past medical history.  Surgical Hx:  Past Surgical History  Procedure Laterality Date  . Tubal ligation    . Cholecystectomy    . Back surgery     Medications: Reviewed & Updated - see associated section   Current outpatient prescriptions:  . alprazolam (XANAX) 2 MG  tablet, Take 2 mg by mouth at bedtime as needed for sleep., Disp: , Rfl:    Social History: Reviewed -  reports that she has quit smoking. She has never used smokeless tobacco.  Objective Findings:  Vitals: Blood pressure 110/60, height  (1.727 m), weight 162 lb (73.483 kg), last menstrual period 12/02/2015.  Physical Examination: General appearance - alert, well appearing, and in no distress Heart- RRR, no murmurs, rubs, gallops  Lungs- Lungs CTA bilaterally. No wheezes, rales or rhonchi HENT- mucous membranes moist, no oropharyngeal edema or erythema, no exudate or tonsillar swelling. Airway patent.  Abdomen - soft, nontender, nondistended, no masses or organomegaly Pelvic - normal external genitalia, vulva, vagina, cervix, uterus and adnexa,  VULVA: normal appearing vulva with no masses, tenderness or lesions,  VAGINA: normal appearing vagina with normal color and discharge, no lesions,  CERVIX: normal appearing cervix without discharge or lesions, stenotic, small  UTERUS: uterus is normal size, shape, consistency, small, no nodularity, anteflexed, 85g per [place]  ADNEXA: normal adnexa in size, nontender and no masses Back exam - full range of motion, no tenderness, palpable spasm or pain on motion Musculoskeletal - no joint tenderness, deformity or swelling Extremities - peripheral pulses normal, no pedal edema, no clubbing or cyanosis Skin - normal coloration and turgor, no rashes, no suspicious skin lesions noted  Discussed with pt risks and benefits of endometrial ablation vs hysterectomy. At end of discussion, pt had opportunity to ask questions and has no further questions at this time. Pt desires to go forward with endometrial ablation.   Greater than 50% was spent in counseling  and coordination of care with the patient. Total time greater than: 25 minutes   Assessment & Plan:   A:  1. Menorrhagia and dysmenorrhea for 17 days  2. Increased lower back pain  prior to and during menses s/p injury in 08/2015  3. DUB 4. GC/CHL collected   P:  1. Schedule CBC, CMP, UA on 12/22/15 and Novasure endometrial ablation with biopsy on 12/23/15  2. D/c Lo Loestrin     By signing my name below, I, Haley Joseph, attest that this documentation has been prepared under the direction and in the presence of Tilda BurrowJohn Jonisha Kindig V, MD. Electronically Signed: Doreatha MartinEva Joseph, ED Scribe. 12/19/2015. 10:18 AM.      I personally performed the services described in this documentation, which was SCRIBED in my presence. The recorded information has been reviewed and considered accurate. It has been edited as necessary during review. Tilda BurrowFERGUSON,Ailey Wessling V, MD

## 2015-12-23 NOTE — Anesthesia Procedure Notes (Signed)
Procedure Name: LMA Insertion Date/Time: 12/23/2015 9:19 AM Performed by: Glynn OctaveANIEL, Glender Augusta E Pre-anesthesia Checklist: Patient identified, Patient being monitored, Emergency Drugs available, Timeout performed and Suction available Patient Re-evaluated:Patient Re-evaluated prior to inductionOxygen Delivery Method: Circle System Utilized Preoxygenation: Pre-oxygenation with 100% oxygen Intubation Type: IV induction Ventilation: Mask ventilation without difficulty LMA: LMA inserted LMA Size: 4.0 Number of attempts: 1 Placement Confirmation: positive ETCO2 and breath sounds checked- equal and bilateral

## 2015-12-23 NOTE — Brief Op Note (Signed)
12/23/2015  10:41 AM  PATIENT:  Haley Joseph  45 y.o. female  PRE-OPERATIVE DIAGNOSIS:  menorrhagia dysmenorrhea  POST-OPERATIVE DIAGNOSIS:  menorrhagia dysmenorrhea  PROCEDURE:  Procedure(s): DILATATION, HYSTEROSCOPY AND ENDOMETRIAL ABLATION (N/A)  SURGEON:  Surgeon(s) and Role:    * Tilda BurrowJohn Mardene Lessig V, MD - Primary cST PHYSICIAN ASSISTANT:   ASSISTANTS: Witt CST   ANESTHESIA:   general and paracervical block  EBL:  Total I/O In: 800 [I.V.:800] Out: 20 [Blood:20]  BLOOD ADMINISTERED:none  DRAINS: none   LOCAL MEDICATIONS USED:  MARCAINE    and Amount: 20 ml  SPECIMEN:  No Specimen  DISPOSITION OF SPECIMEN:  N/A  COUNTS:  YES  TOURNIQUET:  * No tourniquets in log *  DICTATION: .Dragon Dictation  PLAN OF CARE: Discharge to home after PACU  PATIENT DISPOSITION:  PACU - hemodynamically stable.   Delay start of Pharmacological VTE agent (>24hrs) due to surgical blood loss or risk of bleeding: not applicable Details of procedure patient was taken operating room prepped and draped in the usual fashion for vaginal procedure. Speculum was inserted cervix grasped sounded to 9 cm in the anteflexed position dilated to 25 [place] after paracervical block was applied. Then been a previous previous timeout prior to procedure. Allergies to 25 [place] allowing introduction of the 30 operative hysteroscope which confirmed a thin endometrial cavity with normal tubal ostia and no evidence of perforation or masses or abnormality curettage was attempted but there is nothing to curet. The NovaSure endometrial ablation device was prepared inserted with settings of 5 cm length by 3.5 cm width and the ablation sequence was then activated and completed 1 minute 16 seconds at 96 W. The ablation device was removed and an patient tolerated procedure well was allowed to awaken and go to recovery room . Sponge and needle counts correct

## 2015-12-23 NOTE — Transfer of Care (Signed)
Immediate Anesthesia Transfer of Care Note  Patient: Haley Joseph  Procedure(s) Performed: Procedure(s): DILATATION, HYSTEROSCOPY AND ENDOMETRIAL ABLATION (N/A)  Patient Location: PACU  Anesthesia Type:General  Level of Consciousness: awake, alert  and oriented  Airway & Oxygen Therapy: Patient Spontanous Breathing  Post-op Assessment: Report given to RN  Post vital signs: Reviewed and stable  Last Vitals:  Filed Vitals:   12/23/15 0905 12/23/15 0907  BP: 97/57 105/59  Pulse:    Temp:    Resp: 31 11    Last Pain: There were no vitals filed for this visit.    Patients Stated Pain Goal: 6 (12/23/15 0804)  Complications: No apparent anesthesia complications

## 2015-12-23 NOTE — Op Note (Signed)
Please see the brief operative note for details 

## 2015-12-23 NOTE — Anesthesia Preprocedure Evaluation (Signed)
Anesthesia Evaluation  Patient identified by MRN, date of birth, ID band Patient awake    Reviewed: Allergy & Precautions, NPO status , Patient's Chart, lab work & pertinent test results  Airway Mallampati: I  TM Distance: >3 FB Neck ROM: Full    Dental  (+) Teeth Intact   Pulmonary former smoker,    breath sounds clear to auscultation       Cardiovascular negative cardio ROS   Rhythm:Regular Rate:Normal     Neuro/Psych PSYCHIATRIC DISORDERS (ADHD) Anxiety    GI/Hepatic negative GI ROS,   Endo/Other    Renal/GU      Musculoskeletal   Abdominal   Peds  Hematology   Anesthesia Other Findings   Reproductive/Obstetrics                             Anesthesia Physical Anesthesia Plan  ASA: II  Anesthesia Plan: General   Post-op Pain Management:    Induction: Intravenous  Airway Management Planned: LMA  Additional Equipment:   Intra-op Plan:   Post-operative Plan: Extubation in OR  Informed Consent: I have reviewed the patients History and Physical, chart, labs and discussed the procedure including the risks, benefits and alternatives for the proposed anesthesia with the patient or authorized representative who has indicated his/her understanding and acceptance.     Plan Discussed with:   Anesthesia Plan Comments:         Anesthesia Quick Evaluation

## 2015-12-23 NOTE — Discharge Instructions (Signed)
Hysteroscopy Hysteroscopy is a procedure used for looking inside the womb (uterus). It may be done for various reasons, including:  To evaluate abnormal bleeding, fibroid (benign, noncancerous) tumors, polyps, scar tissue (adhesions), and possibly cancer of the uterus.  To look for lumps (tumors) and other uterine growths.  To look for causes of why a woman cannot get pregnant (infertility), causes of recurrent loss of pregnancy (miscarriages), or a lost intrauterine device (IUD).  To perform a sterilization by blocking the fallopian tubes from inside the uterus. In this procedure, a thin, flexible tube with a tiny light and camera on the end of it (hysteroscope) is used to look inside the uterus. A hysteroscopy should be done right after a menstrual period to be sure you are not pregnant. LET [facility] CARE PROVIDER KNOW ABOUT:   Any allergies you have.  All medicines you are taking, including vitamins, herbs, eye drops, creams, and over-the-counter medicines.  Previous problems you or members of your family have had with the use of anesthetics.  Any blood disorders you have.  Previous surgeries you have had.  Medical conditions you have. RISKS AND COMPLICATIONS  Generally, this is a safe procedure. However, as with any procedure, complications can occur. Possible complications include:  Putting a hole in the uterus.  Excessive bleeding.  Infection.  Damage to the cervix.  Injury to other organs.  Allergic reaction to medicines.  Too much fluid used in the uterus for the procedure. BEFORE THE PROCEDURE   Ask your health care provider about changing or stopping any regular medicines.  Do not take aspirin or blood thinners for 1 week before the procedure, or as directed by your health care provider. These can cause bleeding.  If you smoke, do not smoke for 2 weeks before the procedure.  In some cases, a medicine is placed in the cervix the day before the procedure.  This medicine makes the cervix have a larger opening (dilate). This makes it easier for the instrument to be inserted into the uterus during the procedure.  Do not eat or drink anything for at least 8 hours before the surgery.  Arrange for someone to take you home after the procedure. PROCEDURE   You may be given a medicine to relax you (sedative). You may also be given one of the following:  A medicine that numbs the area around the cervix (local anesthetic).  A medicine that makes you sleep through the procedure (general anesthetic).  The hysteroscope is inserted through the vagina into the uterus. The camera on the hysteroscope sends a picture to a TV screen. This gives the surgeon a good view inside the uterus.  During the procedure, air or a liquid is put into the uterus, which allows the surgeon to see better.  Sometimes, tissue is gently scraped from inside the uterus. These tissue samples are sent to a lab for testing. AFTER THE PROCEDURE   If you had a general anesthetic, you may be groggy for a couple hours after the procedure.  If you had a local anesthetic, you will be able to go home as soon as you are stable and feel ready.  You may have some cramping. This normally lasts for a couple days.  You may have bleeding, which varies from light spotting for a few days to menstrual-like bleeding for 3-7 days. This is normal.  If your test results are not back during the visit, make an appointment with your health care provider to find out the  results.   This information is not intended to replace advice given to you by your health care provider. Make sure you discuss any questions you have with your health care provider.   Document Released: 10/25/2000 Document Revised: 05/09/2013 Document Reviewed: 02/15/2013 Elsevier Interactive Patient Education 2016 [employer] PATIENT INSTRUCTIONS POST-ANESTHESIA  IMMEDIATELY FOLLOWING SURGERY:  Do not drive or operate machinery for  the first twenty four hours after surgery.  Do not make any important decisions for twenty four hours after surgery or while taking narcotic pain medications or sedatives.  If you develop intractable nausea and vomiting or a severe headache please notify your doctor immediately.  FOLLOW-UP:  Please make an appointment with your surgeon as instructed. You do not need to follow up with anesthesia unless specifically instructed to do so.  WOUND CARE INSTRUCTIONS (if applicable):  Keep a dry clean dressing on the anesthesia/puncture wound site if there is drainage.  Once the wound has quit draining you may leave it open to air.  Generally you should leave the bandage intact for twenty four hours unless there is drainage.  If the epidural site drains for more than 36-48 hours please call the anesthesia department.  QUESTIONS?:  Please feel free to call your physician or the hospital operator if you have any questions, and they will be happy to assist you.

## 2015-12-23 NOTE — Interval H&P Note (Signed)
History and Physical Interval Note:  12/23/2015 9:04 AM  Haley CitronAngela M Laine  has presented today for surgery, with the diagnosis of abnormal uterine bleeding dysmenorrhea  The various methods of treatment have been discussed with the patient and family. After consideration of risks, benefits and other options for treatment, the patient has consented to  Procedure(s): HYSTEROSCOPY, DILATATION & CURETTAGE AND ENDOMETRIAL ABLATION (N/A) as a surgical intervention .  The patient's history has been reviewed, patient examined, no change in status, stable for surgery.  I have reviewed the patient's chart and labs.  Questions were answered to the patient's satisfaction.   The patient finally stopped her bleeding 2 days ago. Move from [place] completed.  Tilda BurrowFERGUSON,Kenyatte Gruber V

## 2015-12-23 NOTE — Anesthesia Postprocedure Evaluation (Signed)
Anesthesia Post Note  Patient: Griffith Citronngela M Severa  Procedure(s) Performed: Procedure(s) (LRB): DILATATION, HYSTEROSCOPY AND ENDOMETRIAL ABLATION (N/A)  Patient location during evaluation: PACU Anesthesia Type: Spinal Level of consciousness: awake and alert Pain management: pain level controlled Vital Signs Assessment: post-procedure vital signs reviewed and stable Respiratory status: spontaneous breathing Cardiovascular status: blood pressure returned to baseline Postop Assessment: no signs of nausea or vomiting Anesthetic complications: no    Last Vitals:  Filed Vitals:   12/23/15 1030 12/23/15 1038  BP: 118/71 137/91  Pulse: 75 71  Temp:  37.1 C  Resp: 16 18    Last Pain:  Filed Vitals:   12/23/15 1049  PainSc: 0-No pain                 Lajoy Vanamburg

## 2015-12-24 ENCOUNTER — Encounter (HOSPITAL_COMMUNITY): Payer: Self-pay | Admitting: Obstetrics and Gynecology

## 2016-02-02 ENCOUNTER — Ambulatory Visit (INDEPENDENT_AMBULATORY_CARE_PROVIDER_SITE_OTHER): Payer: PRIVATE HEALTH INSURANCE | Admitting: Obstetrics and Gynecology

## 2016-02-02 ENCOUNTER — Encounter: Payer: Self-pay | Admitting: Obstetrics and Gynecology

## 2016-02-02 VITALS — BP 118/60 | Ht 68.0 in | Wt 161.5 lb

## 2016-02-02 DIAGNOSIS — Z09 Encounter for follow-up examination after completed treatment for conditions other than malignant neoplasm: Secondary | ICD-10-CM

## 2016-02-02 DIAGNOSIS — Z9889 Other specified postprocedural states: Secondary | ICD-10-CM

## 2016-02-02 NOTE — Progress Notes (Signed)
Patient ID: Haley Joseph, female   DOB: 03/02/1971, 45 y.o.   MRN: [id number]   Subjective:  Haley Citronngela M Pote is a 45 y.o. female now 6 weeks status post dilation, hysteroscopy and endometrial ablation. Pt reports intermittent vaginal bleeding and pink discharge. She has resumed sexual activity without discomfort.  Review of Systems Negative except vaginal bleeding and pink discharge   Diet:   normal   Bowel movements : normal.  The patient is not having any pain.  Objective:  BP 118/60 mmHg  Ht 5\' 8"  (1.727 m)  Wt 161 lb 8 oz (73.256 kg)  BMI 24.56 kg/m2   General:Well developed, well nourished.  No acute distress. Abdomen: Bowel sounds normal, soft, non-tender. Pelvic Exam:    External Genitalia:  Normal.    Vagina: Normal; brown, non-odorous discharge   Assessment:  Post-Op  6 weeks status post dilation, hysteroscopy and endometrial ablation. Normal post-op visit s/p ablation. Slow healing considered normal. Negative wet prep.  Doing well postoperatively.   Plan:   1. .No current medications 2. Activity restrictions: none 3. return to work: not applicable. 4. Follow up prn. 5. Sign up for My Chart.   By signing my name below, I, Marisue HumbleMichelle Chaffee, attest that this documentation has been prepared under the direction and in the presence of Tilda BurrowJohn V Alistair Senft, MD . Electronically Signed: Marisue HumbleMichelle Chaffee, Scribe. 02/02/2016. 11:50 AM.  A discussion of fees and process for appealing discussed; pt taken aback, as I am, at cost  I personally performed the services described in this documentation, which was SCRIBED in my presence. The recorded information has been reviewed and considered accurate. It has been edited as necessary during review. Tilda BurrowFERGUSON,Kenyon Eshleman V, MD

## 2016-02-02 NOTE — Progress Notes (Signed)
Patient ID: Haley Joseph, female   DOB: 12/10/1970, 45 y.o.   MRN: [id number] Pt here today for follow up. Pt states that she is still having some bleeding and light pink discharge.

## 2016-02-03 DIAGNOSIS — Z09 Encounter for follow-up examination after completed treatment for conditions other than malignant neoplasm: Secondary | ICD-10-CM | POA: Insufficient documentation

## 2016-04-06 ENCOUNTER — Telehealth: Payer: Self-pay | Admitting: *Deleted

## 2016-04-06 MED ORDER — FLUCONAZOLE 150 MG PO TABS: 150.0000 mg | ORAL_TABLET | ORAL | 3 refills | Status: DC | PRN

## 2016-04-06 MED ORDER — NYSTATIN-TRIAMCINOLONE 100000-0.1 UNIT/GM-% EX OINT: 1.0000 "application " | TOPICAL_OINTMENT | Freq: Two times a day (BID) | CUTANEOUS | 99 refills | Status: DC

## 2016-04-06 NOTE — Telephone Encounter (Addendum)
Pt c/o vaginal redness,irritation. Pt now lives in [place] and doesn't have a PCP nor insurance, requesting Rx Diflucan.

## 2016-04-06 NOTE — Telephone Encounter (Signed)
Diflucan and Mycolog sent to pt's current pharmacy.

## 2018-09-21 NOTE — Other (Signed)
Patient does not meet criteria for special pop.No hx of sleep apnea or previous screening.  Denies hx of MH.  Patient does not have a PCP.  CHG skin care kit given and process reviewed.  Not participating in any research study or clinical trials.

## 2018-09-21 NOTE — H&P (Signed)
[facility]  History and Physical Exam    Patient: Sierra Schmitt MRN: [id number]  SSN: [id number]    Date of Birth: 1971-05-29  Age: 48 y.o.  Sex: female      Subjective:      Chief Complaint: right hip pain    History of Present Illness:  Patient complains of right hip pain and difficulty ambulating.     Past Medical History:   Diagnosis Date   ??? 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 Chronic pain     back and joints   ??? Nicotine vapor product user      Past Surgical History:   Procedure Laterality Date   ??? HX CHOLECYSTECTOMY  2002   ??? HX GI  2005    Hemorrhoidectomy   ??? HX HYSTERECTOMY  2018   ??? HX LUMBAR LAMINECTOMY  2007   ??? HX TUBAL LIGATION  1997     Social History     Occupational History   ??? Not on file   Tobacco Use   ??? Smoking status: Former Smoker     Last attempt to quit: 09/21/2010     Years since quitting: 8.0   ??? Smokeless tobacco: Current User   Substance and Sexual Activity   ??? Alcohol use: Not Currently   ??? Drug use: Never   ??? Sexual activity: Not on file     Prior to Admission medications    Not on File     Family History: Diabetes mellitus II, osteoporosis, osteoarthritis     Allergies: No Known Allergies     Review of Systems:  A comprehensive review of systems was negative except for that written in the History of Present Illness.    Objective:       Physical Exam:  HEENT: Normocephalic, atraumatic  Lungs:  Clear to auscultation  Heart:   Regular rate and rhythm  Abdomen: Soft  Extremities:  Pain with range of motion of the right hip  Neurological: Grossly neurovascularly intact    Assessment:      Arthritis of the right hip.    Plan:       The patient has failed previous efforts of conservative management to include non-steroidal anti-inflammatory medications. Due to the fact that conservative efforts failed, the patient became a candidate for surgical intervention.   Proceed with scheduled right total hip arthroplasty, anterior approach.   The various methods of treatment have been discussed with the patient and family. After consideration of risks, benefits, and other options for treatment, the patient has consented to surgical interventions. Questions were answered and preoperative teaching was done by Dr. Gailen Shelter.     Signed By: Manson Allan, PA-C     September 21, 2018

## 2018-09-21 NOTE — Interval H&P Note (Signed)
Patient does not meet criteria for special pop.No hx of sleep apnea or previous screening.  Denies hx of MH.  Patient does not have a PCP.  CHG skin care kit given and process reviewed.  Not participating in any research study or clinical trials.

## 2018-09-21 NOTE — H&P (Signed)
[facility]  History and Physical Exam    Patient: Sierra Schmitt MRN: [id number]  SSN: [id number]    Date of Birth: [birth year]  Age: 48 y.o.  Sex: female      Subjective:      Chief Complaint: right hip pain    History of Present Illness:  Patient complains of right hip pain and difficulty ambulating.     Past Medical History:   Diagnosis Date   ??? 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 Chronic pain     back and joints   ??? Nicotine vapor product user      Past Surgical History:   Procedure Laterality Date   ??? HX CHOLECYSTECTOMY  2002   ??? HX GI  2005    Hemorrhoidectomy   ??? HX HYSTERECTOMY  2018   ??? HX LUMBAR LAMINECTOMY  2007   ??? HX TUBAL LIGATION  1997     Social History     Occupational History   ??? Not on file   Tobacco Use   ??? Smoking status: Former Smoker     Last attempt to quit: 09/21/2010     Years since quitting: 8.0   ??? Smokeless tobacco: Current User   Substance and Sexual Activity   ??? Alcohol use: Not Currently   ??? Drug use: Never   ??? Sexual activity: Not on file     Prior to Admission medications    Not on File     Family History: Diabetes mellitus II, osteoporosis, osteoarthritis     Allergies: No Known Allergies     Review of Systems:  A comprehensive review of systems was negative except for that written in the History of Present Illness.    Objective:       Physical Exam:  HEENT: Normocephalic, atraumatic  Lungs:  Clear to auscultation  Heart:   Regular rate and rhythm  Abdomen: Soft  Extremities:  Pain with range of motion of the right hip  Neurological: Grossly neurovascularly intact    Assessment:      Arthritis of the right hip.    Plan:       The patient has failed previous efforts of conservative management to include non-steroidal anti-inflammatory medications. Due to the fact that conservative efforts failed, the patient became a candidate for surgical intervention.   Proceed with scheduled right total hip arthroplasty, anterior approach.  The various  methods of treatment have been discussed with the patient and family. After consideration of risks, benefits, and other options for treatment, the patient has consented to surgical interventions. Questions were answered and preoperative teaching was done by Dr. Gailen Shelter.     Signed By: Manson Allan, PA-C     September 21, 2018

## 2018-09-22 ENCOUNTER — Inpatient Hospital Stay: Payer: MEDICAID

## 2018-09-25 ENCOUNTER — Inpatient Hospital Stay: Payer: MEDICAID

## 2018-09-25 ENCOUNTER — Ambulatory Visit: Admit: 2018-09-25 | Payer: MEDICAID

## 2018-09-25 LAB — TYPE & SCREEN
ABO/Rh(D): O POS
Antibody screen: NEGATIVE

## 2018-09-25 LAB — TYPE AND SCREEN
ABO/Rh: O POS
Antibody Screen: NEGATIVE

## 2018-09-25 MED ORDER — SODIUM CHLORIDE 0.9 % INJECTION
INTRAMUSCULAR | Status: AC
Start: 2018-09-25 — End: ?

## 2018-09-25 MED ORDER — TRANEXAMIC ACID 1,000 MG/100 ML(10 MG/ML) IN (ISO-OSMOTIC) SODIUM CHLORIDE IVPB
1000 mg/100 mL (10 mg/mL) | Freq: Once | INTRAVENOUS | Status: AC
Start: 2018-09-25 — End: 2018-09-25
  Administered 2018-09-25: 17:00:00 via INTRAVENOUS

## 2018-09-25 MED ORDER — ONDANSETRON (PF) 4 MG/2 ML INJECTION
4 mg/2 mL | INTRAMUSCULAR | Status: DC | PRN
Start: 2018-09-25 — End: 2018-09-26

## 2018-09-25 MED ORDER — BUPIVACAINE LIPOSOME (PF) 266 MG/20 ML (13.3 MG/ML) SUSP, INFILTRATION
1.3 % (13.3 mg/mL) | Status: DC | PRN
Start: 2018-09-25 — End: 2018-09-25
  Administered 2018-09-25: 19:00:00 via INTRAMUSCULAR

## 2018-09-25 MED ORDER — DEXAMETHASONE SODIUM PHOSPHATE 4 MG/ML IJ SOLN
4 mg/mL | INTRAMUSCULAR | Status: AC
Start: 2018-09-25 — End: ?

## 2018-09-25 MED ORDER — SODIUM CHLORIDE 0.9 % IJ SYRG
Freq: Three times a day (TID) | INTRAMUSCULAR | Status: DC
Start: 2018-09-25 — End: 2018-09-26
  Administered 2018-09-25 – 2018-09-26 (×2): via INTRAVENOUS

## 2018-09-25 MED ORDER — MIDAZOLAM (PF) 1 MG/ML INJECTION SOLUTION
1 mg/mL | INTRAMUSCULAR | Status: AC
Start: 2018-09-25 — End: ?

## 2018-09-25 MED ORDER — HYDROMORPHONE (PF) 1 MG/ML IJ SOLN
1 mg/mL | INTRAMUSCULAR | Status: DC | PRN
Start: 2018-09-25 — End: 2018-09-25
  Administered 2018-09-25: 20:00:00 via INTRAVENOUS

## 2018-09-25 MED ORDER — LACTATED RINGERS IV
INTRAVENOUS | Status: DC
Start: 2018-09-25 — End: 2018-09-26
  Administered 2018-09-25 (×3): via INTRAVENOUS

## 2018-09-25 MED ORDER — FENTANYL CITRATE (PF) 50 MCG/ML IJ SOLN
50 mcg/mL | INTRAMUSCULAR | Status: AC
Start: 2018-09-25 — End: ?

## 2018-09-25 MED ORDER — PHENYLEPHRINE 1 MG/10 ML (100 MCG/ML) IN NS IV SYRINGE
1 mg/0 mL (00 mcg/mL) | INTRAVENOUS | Status: AC
Start: 2018-09-25 — End: ?

## 2018-09-25 MED ORDER — DOCUSATE SODIUM 100 MG CAP
100 mg | Freq: Every day | ORAL | Status: DC
Start: 2018-09-25 — End: 2018-09-26
  Administered 2018-09-26: 14:00:00 via ORAL

## 2018-09-25 MED ORDER — OXYCODONE-ACETAMINOPHEN 5 MG-325 MG TAB
5-325 mg | ORAL | Status: DC | PRN
Start: 2018-09-25 — End: 2018-09-26
  Administered 2018-09-25 – 2018-09-26 (×4): via ORAL

## 2018-09-25 MED ORDER — ALBUTEROL SULFATE 0.083 % (0.83 MG/ML) SOLN FOR INHALATION
2.5 mg /3 mL (0.083 %) | RESPIRATORY_TRACT | Status: DC | PRN
Start: 2018-09-25 — End: 2018-09-25

## 2018-09-25 MED ORDER — SODIUM CHLORIDE 0.9 % IJ SYRG
INTRAMUSCULAR | Status: DC | PRN
Start: 2018-09-25 — End: 2018-09-26

## 2018-09-25 MED ORDER — KETOROLAC TROMETHAMINE 15 MG/ML INJECTION
15 mg/mL | INTRAMUSCULAR | Status: AC
Start: 2018-09-25 — End: ?

## 2018-09-25 MED ORDER — HYDROMORPHONE (PF) 1 MG/ML IJ SOLN
1 mg/mL | INTRAMUSCULAR | Status: DC | PRN
Start: 2018-09-25 — End: 2018-09-26

## 2018-09-25 MED ORDER — MORPHINE 4 MG/ML INTRAVENOUS SOLUTION
4 mg/mL | INTRAVENOUS | Status: AC
Start: 2018-09-25 — End: ?

## 2018-09-25 MED ORDER — DIPHENHYDRAMINE 25 MG CAP
25 mg | ORAL | Status: DC | PRN
Start: 2018-09-25 — End: 2018-09-26

## 2018-09-25 MED ORDER — DEXTROSE 10% IN WATER (D10W) IV
10 % | INTRAVENOUS | Status: DC | PRN
Start: 2018-09-25 — End: 2018-09-25

## 2018-09-25 MED ORDER — BACITRACIN 50,000 UNIT IM
50000 unit | INTRAMUSCULAR | Status: AC
Start: 2018-09-25 — End: ?

## 2018-09-25 MED ORDER — KETOROLAC TROMETHAMINE 15 MG/ML INJECTION
15 mg/mL | INTRAMUSCULAR | Status: DC | PRN
Start: 2018-09-25 — End: 2018-09-25
  Administered 2018-09-25: 19:00:00 via INTRAMUSCULAR

## 2018-09-25 MED ORDER — DEXMEDETOMIDINE 100 MCG/ML IV SOLN
100 mcg/mL | INTRAVENOUS | Status: AC
Start: 2018-09-25 — End: ?

## 2018-09-25 MED ORDER — INSULIN LISPRO 100 UNIT/ML INJECTION
100 unit/mL | Freq: Once | SUBCUTANEOUS | Status: DC
Start: 2018-09-25 — End: 2018-09-25
  Administered 2018-09-25: 20:00:00 via SUBCUTANEOUS

## 2018-09-25 MED ORDER — TRANEXAMIC ACID 1,000 MG/10 ML (100 MG/ML) IV
1000 mg/10 mL (100 mg/mL) | Freq: Once | INTRAVENOUS | Status: DC
Start: 2018-09-25 — End: 2018-09-25
  Administered 2018-09-25: 18:00:00 via INTRA_ARTICULAR

## 2018-09-25 MED ORDER — LIDOCAINE (PF) 20 MG/ML (2 %) IJ SOLN
20 mg/mL (2 %) | INTRAMUSCULAR | Status: AC
Start: 2018-09-25 — End: ?

## 2018-09-25 MED ORDER — BUPIVACAINE-EPINEPHRINE (PF) 0.5 %-1:200,000 IJ SOLN
0.5 %-1:200,000 | INTRAMUSCULAR | Status: AC
Start: 2018-09-25 — End: ?

## 2018-09-25 MED ORDER — NALOXONE 0.4 MG/ML INJECTION
0.4 mg/mL | INTRAMUSCULAR | Status: DC | PRN
Start: 2018-09-25 — End: 2018-09-25

## 2018-09-25 MED ORDER — PREGABALIN 100 MG CAP
100 mg | ORAL | Status: AC
Start: 2018-09-25 — End: 2018-09-25
  Administered 2018-09-25: 16:00:00 via ORAL

## 2018-09-25 MED ORDER — DEXMEDETOMIDINE 100 MCG/ML IV SOLN
100 mcg/mL | INTRAVENOUS | Status: DC | PRN
Start: 2018-09-25 — End: 2018-09-25
  Administered 2018-09-25: 18:00:00 via INTRAVENOUS

## 2018-09-25 MED ORDER — ONDANSETRON (PF) 4 MG/2 ML INJECTION
4 mg/2 mL | INTRAMUSCULAR | Status: DC | PRN
Start: 2018-09-25 — End: 2018-09-25
  Administered 2018-09-25: 19:00:00 via INTRAVENOUS

## 2018-09-25 MED ORDER — CEFAZOLIN 2 GM/50 ML IN DEXTROSE (ISO-OSMOTIC) IVPB
2 gram/50 mL | Freq: Three times a day (TID) | INTRAVENOUS | Status: AC
Start: 2018-09-25 — End: 2018-09-26
  Administered 2018-09-26 (×2): via INTRAVENOUS

## 2018-09-25 MED ORDER — FENTANYL CITRATE (PF) 50 MCG/ML IJ SOLN
50 mcg/mL | INTRAMUSCULAR | Status: DC | PRN
Start: 2018-09-25 — End: 2018-09-25

## 2018-09-25 MED ORDER — KETAMINE 50 MG/ML IJ SOLN
50 mg/mL | INTRAMUSCULAR | Status: DC | PRN
Start: 2018-09-25 — End: 2018-09-25
  Administered 2018-09-25 (×2): via INTRAVENOUS

## 2018-09-25 MED ORDER — TRANEXAMIC ACID 1,000 MG/100 ML(10 MG/ML) IN (ISO-OSMOTIC) SODIUM CHLORIDE IVPB
1000 mg/100 mL (10 mg/mL) | INTRAVENOUS | Status: AC
Start: 2018-09-25 — End: ?

## 2018-09-25 MED ORDER — DEXAMETHASONE SODIUM PHOSPHATE 4 MG/ML IJ SOLN
4 mg/mL | INTRAMUSCULAR | Status: DC | PRN
Start: 2018-09-25 — End: 2018-09-25
  Administered 2018-09-25: 18:00:00 via INTRAVENOUS

## 2018-09-25 MED ORDER — KETAMINE 50 MG/5 ML (10 MG/ML) IN NS IV SYRINGE
50 mg/5 mL (10 mg/mL) | INTRAVENOUS | Status: AC
Start: 2018-09-25 — End: ?

## 2018-09-25 MED ORDER — PHENYLEPHRINE 10 MG/ML INJECTION
10 mg/mL | INTRAMUSCULAR | Status: DC | PRN
Start: 2018-09-25 — End: 2018-09-25
  Administered 2018-09-25 (×2): via INTRAVENOUS

## 2018-09-25 MED ORDER — ROCURONIUM 10 MG/ML IV
10 mg/mL | INTRAVENOUS | Status: DC | PRN
Start: 2018-09-25 — End: 2018-09-25
  Administered 2018-09-25 (×3): via INTRAVENOUS

## 2018-09-25 MED ORDER — SODIUM CHLORIDE 0.9 % IRRIGATION SOLN
0.9 % | Status: DC | PRN
Start: 2018-09-25 — End: 2018-09-25
  Administered 2018-09-25: 19:00:00

## 2018-09-25 MED ORDER — EPINEPHRINE (PF) 1 MG/ML INJECTION
11 mg/mL ( mL) | INTRAMUSCULAR | Status: DC | PRN
Start: 2018-09-25 — End: 2018-09-25
  Administered 2018-09-25: 19:00:00

## 2018-09-25 MED ORDER — SODIUM CHLORIDE 0.9 % IRRIGATION SOLN
0.9 % | Status: DC | PRN
Start: 2018-09-25 — End: 2018-09-25
  Administered 2018-09-25: 17:00:00

## 2018-09-25 MED ORDER — DIPHENHYDRAMINE HCL 50 MG/ML IJ SOLN
50 mg/mL | INTRAMUSCULAR | Status: DC | PRN
Start: 2018-09-25 — End: 2018-09-25

## 2018-09-25 MED ORDER — SODIUM CHLORIDE 0.9 % IJ SYRG
INTRAMUSCULAR | Status: DC | PRN
Start: 2018-09-25 — End: 2018-09-25

## 2018-09-25 MED ORDER — GLUCOSE 4 GRAM CHEWABLE TAB
4 gram | ORAL | Status: DC | PRN
Start: 2018-09-25 — End: 2018-09-25

## 2018-09-25 MED ORDER — CELECOXIB 100 MG CAP
100 mg | ORAL | Status: AC
Start: 2018-09-25 — End: 2018-09-25
  Administered 2018-09-25: 16:00:00 via ORAL

## 2018-09-25 MED ORDER — TRANEXAMIC ACID 1,000 MG/10 ML (100 MG/ML) IV
1000 mg/10 mL (100 mg/mL) | INTRAVENOUS | Status: AC
Start: 2018-09-25 — End: ?

## 2018-09-25 MED ORDER — SUGAMMADEX 100 MG/ML INTRAVENOUS SOLUTION
100 mg/mL | INTRAVENOUS | Status: DC | PRN
Start: 2018-09-25 — End: 2018-09-25
  Administered 2018-09-25: 19:00:00 via INTRAVENOUS

## 2018-09-25 MED ORDER — GLUCAGON 1 MG INJECTION
1 mg | INTRAMUSCULAR | Status: DC | PRN
Start: 2018-09-25 — End: 2018-09-25

## 2018-09-25 MED ORDER — EPINEPHRINE (PF) 1 MG/ML INJECTION
1 mg/mL ( mL) | INTRAMUSCULAR | Status: AC
Start: 2018-09-25 — End: ?

## 2018-09-25 MED ORDER — ACETAMINOPHEN 500 MG TAB
500 mg | ORAL | Status: AC
Start: 2018-09-25 — End: 2018-09-25
  Administered 2018-09-25: 16:00:00 via ORAL

## 2018-09-25 MED ORDER — NALOXONE 0.4 MG/ML INJECTION
0.4 mg/mL | INTRAMUSCULAR | Status: DC | PRN
Start: 2018-09-25 — End: 2018-09-26

## 2018-09-25 MED ORDER — LACTATED RINGERS IV
INTRAVENOUS | Status: DC
Start: 2018-09-25 — End: 2018-09-25
  Administered 2018-09-25: 20:00:00 via INTRAVENOUS

## 2018-09-25 MED ORDER — ASPIRIN 81 MG TAB, DELAYED RELEASE
81 mg | Freq: Two times a day (BID) | ORAL | Status: DC
Start: 2018-09-25 — End: 2018-09-26
  Administered 2018-09-26 (×2): via ORAL

## 2018-09-25 MED ORDER — ONDANSETRON (PF) 4 MG/2 ML INJECTION
4 mg/2 mL | Freq: Once | INTRAMUSCULAR | Status: DC
Start: 2018-09-25 — End: 2018-09-25
  Administered 2018-09-25: 20:00:00 via INTRAVENOUS

## 2018-09-25 MED ORDER — HYDROCODONE-ACETAMINOPHEN 5 MG-325 MG TAB
5-325 mg | ORAL | Status: DC | PRN
Start: 2018-09-25 — End: 2018-09-25

## 2018-09-25 MED ORDER — POLYMYXIN B SULFATE 500,000 UNIT IJ SOLR
500000 unit | INTRAMUSCULAR | Status: AC
Start: 2018-09-25 — End: ?

## 2018-09-25 MED ORDER — PROPOFOL 10 MG/ML IV EMUL
10 mg/mL | INTRAVENOUS | Status: AC
Start: 2018-09-25 — End: ?

## 2018-09-25 MED ORDER — TRANEXAMIC ACID 1,000 MG/10 ML (100 MG/ML) IV
1000 mg/10 mL (100 mg/mL) | INTRAVENOUS | Status: DC | PRN
Start: 2018-09-25 — End: 2018-09-25
  Administered 2018-09-25: 19:00:00 via INTRA_ARTICULAR

## 2018-09-25 MED ORDER — ROCURONIUM 10 MG/ML IV
10 mg/mL | INTRAVENOUS | Status: AC
Start: 2018-09-25 — End: ?

## 2018-09-25 MED ORDER — SODIUM CHLORIDE 0.9 % IJ SYRG
Freq: Three times a day (TID) | INTRAMUSCULAR | Status: DC
Start: 2018-09-25 — End: 2018-09-25
  Administered 2018-09-25: 20:00:00 via INTRAVENOUS

## 2018-09-25 MED ORDER — CEFAZOLIN 2 GM/50 ML IN DEXTROSE (ISO-OSMOTIC) IVPB
2 gram/50 mL | Freq: Once | INTRAVENOUS | Status: AC
Start: 2018-09-25 — End: 2018-09-25
  Administered 2018-09-25: 17:00:00 via INTRAVENOUS

## 2018-09-25 MED ORDER — FLU VACCINE QV 2019-20 (6 MOS+)(PF) 60 MCG (15 MCG X 4)/0.5 ML IM SYRINGE
60 mcg (15 mcg x 4)/0.5 mL | INTRAMUSCULAR | Status: AC
Start: 2018-09-25 — End: 2018-09-26
  Administered 2018-09-26: 15:00:00 via INTRAMUSCULAR

## 2018-09-25 MED ORDER — FENTANYL CITRATE (PF) 50 MCG/ML IJ SOLN
50 mcg/mL | INTRAMUSCULAR | Status: DC | PRN
Start: 2018-09-25 — End: 2018-09-25
  Administered 2018-09-25 (×2): via INTRAVENOUS

## 2018-09-25 MED ORDER — PROPOFOL 10 MG/ML IV EMUL
10 mg/mL | INTRAVENOUS | Status: DC | PRN
Start: 2018-09-25 — End: 2018-09-25
  Administered 2018-09-25: 17:00:00 via INTRAVENOUS

## 2018-09-25 MED ORDER — SUGAMMADEX 100 MG/ML INTRAVENOUS SOLUTION
100 mg/mL | INTRAVENOUS | Status: AC
Start: 2018-09-25 — End: ?

## 2018-09-25 MED ORDER — LACTATED RINGERS IV
INTRAVENOUS | Status: DC
Start: 2018-09-25 — End: 2018-09-26
  Administered 2018-09-25: 22:00:00 via INTRAVENOUS

## 2018-09-25 MED ORDER — BUPIVACAINE LIPOSOME (PF) 266 MG/20 ML (13.3 MG/ML) SUSP, INFILTRATION
1.3 % (13.3 mg/mL) | Status: AC
Start: 2018-09-25 — End: ?

## 2018-09-25 MED FILL — OXYCODONE-ACETAMINOPHEN 5 MG-325 MG TAB: 5-325 mg | ORAL | Qty: 2

## 2018-09-25 MED FILL — PROPOFOL 10 MG/ML IV EMUL: 10 mg/mL | INTRAVENOUS | Qty: 20

## 2018-09-25 MED FILL — LACTATED RINGERS IV: INTRAVENOUS | Qty: 1000

## 2018-09-25 MED FILL — BD POSIFLUSH NORMAL SALINE 0.9 % INJECTION SYRINGE: INTRAMUSCULAR | Qty: 40

## 2018-09-25 MED FILL — FENTANYL CITRATE (PF) 50 MCG/ML IJ SOLN: 50 mcg/mL | INTRAMUSCULAR | Qty: 2

## 2018-09-25 MED FILL — CEFAZOLIN 2 GM/50 ML IN DEXTROSE (ISO-OSMOTIC) IVPB: 2 gram/50 mL | INTRAVENOUS | Qty: 50

## 2018-09-25 MED FILL — TRANEXAMIC ACID 1,000 MG/100 ML(10 MG/ML) IN (ISO-OSMOTIC) SODIUM CHLORIDE IVPB: 1000 mg/100 mL (10 mg/mL) | INTRAVENOUS | Qty: 100

## 2018-09-25 MED FILL — TRANEXAMIC ACID 1,000 MG/10 ML (100 MG/ML) IV: 1000 mg/10 mL (100 mg/mL) | INTRAVENOUS | Qty: 10

## 2018-09-25 MED FILL — CELECOXIB 100 MG CAP: 100 mg | ORAL | Qty: 2

## 2018-09-25 MED FILL — MORPHINE 4 MG/ML INTRAVENOUS SOLUTION: 4 mg/mL | INTRAVENOUS | Qty: 1

## 2018-09-25 MED FILL — HYDROMORPHONE (PF) 1 MG/ML IJ SOLN: 1 mg/mL | INTRAMUSCULAR | Qty: 1

## 2018-09-25 MED FILL — EPINEPHRINE (PF) 1 MG/ML INJECTION: 1 mg/mL ( mL) | INTRAMUSCULAR | Qty: 3

## 2018-09-25 MED FILL — SENSORCAINE-MPF/EPINEPHRINE 0.5 %-1:200,000 INJECTION SOLUTION: 0.5 %-1:200,000 | INTRAMUSCULAR | Qty: 30

## 2018-09-25 MED FILL — LYRICA 100 MG CAPSULE: 100 mg | ORAL | Qty: 1

## 2018-09-25 MED FILL — DEXMEDETOMIDINE 100 MCG/ML IV SOLN: 100 mcg/mL | INTRAVENOUS | Qty: 2

## 2018-09-25 MED FILL — KETAMINE 50 MG/5 ML (10 MG/ML) IN NS IV SYRINGE: 50 mg/5 mL (10 mg/mL) | INTRAVENOUS | Qty: 5

## 2018-09-25 MED FILL — MIDAZOLAM (PF) 1 MG/ML INJECTION SOLUTION: 1 mg/mL | INTRAMUSCULAR | Qty: 2

## 2018-09-25 MED FILL — SODIUM CHLORIDE 0.9 % INJECTION: INTRAMUSCULAR | Qty: 40

## 2018-09-25 MED FILL — DEXAMETHASONE SODIUM PHOSPHATE 4 MG/ML IJ SOLN: 4 mg/mL | INTRAMUSCULAR | Qty: 2

## 2018-09-25 MED FILL — ROCURONIUM 10 MG/ML IV: 10 mg/mL | INTRAVENOUS | Qty: 5

## 2018-09-25 MED FILL — BACITRACIN 50,000 UNIT IM: 50000 unit | INTRAMUSCULAR | Qty: 150000

## 2018-09-25 MED FILL — POLYMYXIN B SULFATE 500,000 UNIT IJ SOLR: 500000 unit | INTRAMUSCULAR | Qty: 1500000

## 2018-09-25 MED FILL — BRIDION 100 MG/ML INTRAVENOUS SOLUTION: 100 mg/mL | INTRAVENOUS | Qty: 2

## 2018-09-25 MED FILL — XYLOCAINE-MPF 20 MG/ML (2 %) INJECTION SOLUTION: 20 mg/mL (2 %) | INTRAMUSCULAR | Qty: 10

## 2018-09-25 MED FILL — MAPAP EXTRA STRENGTH 500 MG TABLET: 500 mg | ORAL | Qty: 2

## 2018-09-25 MED FILL — PHENYLEPHRINE 1 MG/10 ML (100 MCG/ML) IN NS IV SYRINGE: 1 mg/0 mL (00 mcg/mL) | INTRAVENOUS | Qty: 10

## 2018-09-25 MED FILL — EXPAREL (PF) 1.3 % (13.3 MG/ML) SUSPENSION FOR LOCAL INFILTRATION: 1.3 % (13.3 mg/mL) | Qty: 20

## 2018-09-25 MED FILL — KETOROLAC TROMETHAMINE 15 MG/ML INJECTION: 15 mg/mL | INTRAMUSCULAR | Qty: 2

## 2018-09-25 NOTE — Progress Notes (Addendum)
1920 - Bedside report received from Erica Turner RN. Patient sitting to side of bed at this time. Family in room.  Pain 7/10. Pt has ambulated and voided.Plan of care for the day addressed with the patient. White board updated. Pt informed to utilize call bell or this RN's zone phone for any questions, concerns, or needs. Pt verbalized understanding. Phone, call bell, and personal items w/in pt reach. Will continue monitor.   2106 PRN Percocet 2 tabs administered per pt request for pain rated 7/10. Will continue to monitor.   2045  - Patient in bed at this time. A/O x 4. IV to R hand  intact and patent.  TEDs and Plexis to BLE. Mepilex dressing to R hip CDI. Pt denies numbness/tingling. Pulses positive, palpable. Lungs clear. Bowel sounds active to all quadrants. Patient able to get to 2500 on the incentive spirometer. Pain 7/10. Pt states she has been having continuous pain for many years. She states she feels some surgical pain to R hip but she is still experiencing pain from L hip and sacrum. Will continue to monitor.   0102 PRN Percocet 2 tabs administered per pt request for pain rated 7/10. Pt ambulated to bathroom, voided, and returned to bed. No s/s of distress noted. Will continue to monitor.   0506 PRN Percocet 2 mg administered per pt request for pain and in anticipation of PT. Pt ambulated through hallways of unit utilizing walker, assisted by this RN. Pt returned to bed. Will continue to monitor.   CHG wipes completed by Andrea Gatlin RN  Bedside and Verbal shift change report given to Peyton Howder Rn  by Amy Floyd, RN. Report included the following information SBAR, Kardex, OR Summary, Intake/Output and MAR.

## 2018-09-25 NOTE — Progress Notes (Signed)
Problem: Falls - Risk of  Goal: *Absence of Falls  Description  Document Schmid Fall Risk and appropriate interventions in the flowsheet.  Outcome: Progressing Towards Goal  Note: Fall Risk Interventions:  Mobility Interventions: Patient to call before getting OOB         Medication Interventions: Patient to call before getting OOB, Teach patient to arise slowly    Elimination Interventions: Patient to call for help with toileting needs, Call light in reach              Problem: Patient Education: Go to Patient Education Activity  Goal: Patient/Family Education  Outcome: Progressing Towards Goal     Problem: Pain  Goal: *Control of Pain  Outcome: Progressing Towards Goal     Problem: Hip Replacement: Day of Surgery/Unit  Goal: Activity/Safety  Outcome: Progressing Towards Goal  Goal: Consults, if ordered  Outcome: Progressing Towards Goal  Goal: Diagnostic Test/Procedures  Outcome: Progressing Towards Goal  Goal: Nutrition/Diet  Outcome: Progressing Towards Goal  Goal: Medications  Outcome: Progressing Towards Goal  Goal: Respiratory  Outcome: Progressing Towards Goal  Goal: Treatments/Interventions/Procedures  Outcome: Progressing Towards Goal  Goal: Psychosocial  Outcome: Progressing Towards Goal  Goal: *Initiate mobility  Outcome: Progressing Towards Goal  Goal: *Optimal pain control at patient's stated goal  Outcome: Progressing Towards Goal  Goal: *Hemodynamically stable  Outcome: Progressing Towards Goal

## 2018-09-25 NOTE — Interval H&P Note (Signed)
H&P Update:  Sierra Schmitt was seen and examined.  History and physical has been reviewed. The patient has been examined. There have been no significant clinical changes since the completion of the originally dated History and Physical.  Patient identified by surgeon; surgical site was confirmed by patient and surgeon.  Pt understands that today's procedure will hopefully help her hip pain only.  This is the pain she feels in her groin.  She understands that today's operation will not help her back pain, her tailbone pain, the pain that runs down her leg or the pain in her shin.    Leg length x-rays show her legs to be nearly equal in length even though she feels her left leg is significantly shorter.  Today I will try to keep her left leg as near as possible to its current length (other than correcting for cartilage loss).  She is scheduled to have her left hip done next month and at that time we can consider lengthening the left leg somewhat.  She understands that we may lengthen the rt leg today if needed to maintain stability of the hip.  We discussed doing x-rays of her back prior to her left leg surgery.

## 2018-09-25 NOTE — Anesthesia Post-Procedure Evaluation (Signed)
Post-Anesthesia Evaluation and Assessment    Cardiovascular Function/Vital Signs  Visit Vitals  BP 126/66   Pulse 72   Temp 36.2 ??C (97.2 ??F)   Resp 19   Ht 5' 8"  (1.727 m)   Wt 86.3 kg (190 lb 5 oz)   SpO2 100%   BMI 28.94 kg/m??       Patient is status post Procedure(s):  RIGHT TOTAL HIP REPLACEMENT ANTERIOR APPROACH WITH C-ARM.    Nausea/Vomiting: Controlled.    Postoperative hydration reviewed and adequate.    Pain:  Pain Scale 1: FLACC (09/25/18 1530)  Pain Intensity 1: 0 (09/25/18 1530)   Managed.    Neurological Status:   Neuro (WDL): Within Defined Limits (09/25/18 1513)   At baseline.    Mental Status and Level of Consciousness: Arousable.    Pulmonary Status:   O2 Device: Nasal cannula (09/25/18 1513)   Adequate oxygenation and airway patent.    Complications related to anesthesia: None    Post-anesthesia assessment completed. No concerns.    Patient has met all discharge requirements.    Signed By: Vanetta Shawl, CRNA    September 25, 2018

## 2018-09-25 NOTE — Other (Signed)
Bedside and Verbal shift change report given to Amy, RN (oncoming nurse) by E. Turner, RN (offgoing nurse). Report included the following information SBAR, Kardex, OR Summary, Intake/Output, MAR and Recent Results.

## 2018-09-25 NOTE — Other (Signed)
Reviewed PTA medication list with patient/caregiver and patient/caregiver denies any additional medications.     Patient admits to having a responsible adult care for them at home for at least 24 hours after surgery.    Patient encouraged to use gown warming system and informed that using said warming gown to regulate body temperature prior to a procedure has been shown to help reduce the risks of blood clots and infection.    Dual skin assessment & fall risk band verification completed with Aubrey Thacker RN.

## 2018-09-25 NOTE — Other (Signed)
Unable to contact pt family on phone number provided, pt room assignment is [phone]

## 2018-09-25 NOTE — Brief Op Note (Signed)
BRIEF OPERATIVE NOTE    Date of Procedure: 09/25/2018   Preoperative Diagnosis: UNILATERAL PRIMARY OSTEOARTHRITIS RIGHT HIP  Postoperative Diagnosis: UNILATERAL PRIMARY OSTEOARTHRITIS RIGHT HIP    Procedure(s):  RIGHT TOTAL HIP REPLACEMENT ANTERIOR APPROACH WITH C-ARM  Surgeon(s) and Role:     * Swenson, Jon, MD - Primary         Surgical Assistant: Eladio Dentremont, PA-C    Surgical Staff:  Circ-1: Richards, Courtney A, RN  Physician Assistant: Frank Pilger M, PA-C  Radiology Technician: Bryson, Daniel J, RT, R  Scrub Tech-1: Hockaday, Cynthia  Scrub RN-1: Bray, Allyse L, RN; Cheely, Margo E, RN  Float Staff: Davis, Cindy, RN  Event Time In Time Out   Incision Start 1228    Incision Close 1421      Anesthesia: General   Estimated Blood Loss: 450mL  Specimens: * No specimens in log *   Findings: Severe DJD   Complications: None  Implants:   Implant Name Type Inv. Item Serial No. Manufacturer Lot No. LRB No. Used Action   LINER ACET PINN NEUT [id number] -- ALTRX - [id number]  LINER ACET PINN NEUT 36X56MM -- ALTRX  [employer] [id number] Right 1 Implanted   CUP ACET SECTOR GRIPTION 56MM -- TI - [id number]  CUP ACET SECTOR GRIPTION 56MM -- TI  [employer] [id number] Right 1 Implanted   PLUG ACET APCL H ELIM POS STP --  - [id number]  PLUG ACET APCL H ELIM POS STP --   [employer] [id number] Right 1 Implanted   SCR ACET CANC PINN 6.5X20MM SS --  - [id number]  SCR ACET CANC PINN 6.5X20MM SS --   [employer] [id number] Right 1 Implanted   STEM FEM TAPR SZ4 STD OFFSET -- ACTIS - [id number]  STEM FEM TAPR SZ4 STD OFFSET -- ACTIS  [employer] [id number] Right 1 Implanted   HEAD FEM 36MM +1.5MM NK -- BIOLOX DELTA - [id number]  HEAD FEM 36MM +1.5MM NK -- BIOLOX DELTA  [employer] [id number] Right 1 Implanted

## 2018-09-25 NOTE — Other (Signed)
Verbal Report given to Amanda Pryor, RN

## 2018-09-25 NOTE — Progress Notes (Addendum)
1616 - Received patient from PACU in satisfactory condition. VSS. Assumed care of patient. Dual skin assessment completed with Amanda, RN. No pressure areas noted. Fall risk band in place. Patient is alert and oriented x 4. Patient is calm and cooperative. Denies numbness or tingling to BLE. Pedal pulses palpable and equal bilaterally. Capillary Refill < 3 seconds. Lung sounds clear bilaterally. Respirations even and unlabored. No use of accessory muscles. Educated on incentive spirometer and demonstrated proper use. Abdomen is soft and non-tender. Bowel sounds active to all quadrants. Patient has not voided post-operatively. No bladder distention evident. No complaints of bladder discomfort. Skin is warm, dry and skin color is appropriate to race. Optifoam dressing to right hip noted CDI. No other skin integrity issues present. Ted hose applied to BLE. Sequential compression device applied to BLE. IV intact to right hand and infusing without difficulty. Reports pain 10/10. Patient repositioned in bed with ice packs in place. Patient oriented to call bell use as well as bed use. Patient oriented to phone and how to order meals. Call bell within reach. Bed in low position. Three side rails up.    1722 - PRN Percocet administered for 10/10 pain to right hip. Patient sitting on edge of bed eating with visitor present. Call light in reach.

## 2018-09-25 NOTE — Op Note (Signed)
[facility]  Total Left Hip Arthroplasty    Patient: Sierra Schmitt MRN: [id number]  SSN: [id number]    Date of Birth: 04-Aug-1970  Age: 48 y.o.  Sex: female      Date of Surgery: 09/25/2018   Preoperative Diagnosis: UNILATERAL PRIMARY OSTEOARTHRITIS RIGHT HIP   Postoperative Diagnosis: UNILATERAL PRIMARY OSTEOARTHRITIS RIGHT HIP   Location: [facility]  Surgeon: Shaaron Adler, MD  Assistant:  Victorio Palm PA - C    Anesthesia: General     Procedure: Total rt Hip Arthroplasty    Findings:  Degenerative joint disease of the rt hip.     Estimated Blood Loss: 450 cc    Specimens: None    Complications: None    Implants:   Implant Name Type Inv. Item Serial No. Manufacturer Lot No. LRB No. Used Action   LINER ACET PINN NEUT 36X56MM -- ALTRX - [id number]  LINER ACET PINN NEUT 36X56MM -- ALTRX  [employer] [id number] Right 1 Implanted   CUP ACET SECTOR GRIPTION -- TI - [id number]  CUP ACET SECTOR GRIPTION -- TI  [employer] [id number] Right 1 Implanted   PLUG ACET APCL H ELIM POS STP --  - [id number]  PLUG ACET APCL H ELIM POS STP --   [employer] [id number] Right 1 Implanted   SCR ACET CANC PINN 6.5X20MM SS --  - [id number]  SCR ACET CANC PINN 6.5X20MM SS --   [employer] [id number] Right 1 Implanted   STEM FEM TAPR SZ4 STD OFFSET -- ACTIS - [id number]  STEM FEM TAPR SZ4 STD OFFSET -- ACTIS  [employer] [id number] Right 1 Implanted   HEAD FEM +1.5MM NK -- BIOLOX DELTA - [id number]  HEAD FEM +1.5MM NK -- BIOLOX DELTA  [employer] [id number] Right 1 Implanted       Procedure Detail:  After the patient was brought to the operating suite, She was effectively anesthetized using general anesthesia, then transferred to the Hana table and secured in a standard fashion. Her rt hip was then prepped with Chloroprep and draped in a normal sterile orthopedic fashion. She was  given appropriate intravenous antibiotic preoperatively. After a proper timeout was performed, a direct anterior approach to the hip was performed using a short Smith-Petersen interval. The incision was placed approximately 3 cm lateral to the anterior superior iliac spine and progressed distally and laterally for a length of approximately 4 inches. Incision was made through the Tensor Fascia Lata with the knife and continued with the Mayo scissors. An Allis clamp was placed on the medial border of the Tensor Fascia Lata and disection continued on the medial border of Tensor Fascia Lata Muscle. Dissection was continued down to the lateral hip capsule. A Cobra retractor was placed on the lateral hip capsule. A Hibbs Retractor was placed distally and a second Cobra retractor was placed on the medial hip capsule. The Lateral Femoral Circumflex Vessels were identified clamped and cauterized. Anterior capsulotomy was performed. Portions of the capsule were removed. The Cobra retractors were then placed on the femoral neck. The degenerative changes of the hip were noted. Femoral neck osteotomy was then performed. The head and neck were removed. The neck length was confirmed with the image intensification. The labrum was excised. The acetabulum was then reamed up to 56 mm with good bleeding bone in all quadrants obtained. The cup was then irrigated with pulse lavage system. A 56 mm Depuy Cluster  hole cup was then impacted in place with excellent stable fixation obtained, placing the cup at about 45 degrees of abduction, 20 degrees of anteversion.  The hole eliminator was placed. one screw was placed. The 36 mm inner diameter polyethylene liner was then impacted in place. Attention was turned to the femur, which was delivered into the wound with a combination of extension, external rotation, and adduction, and using the hook on the Hana table to deliver the femur into the wound. The box  osteotome was used followed by the lateralizing broach. The canal was broached up to a size 3. A trial reduction was then performed with the standard neck offset and 1.5 x 36 mm head trial. This was evaluated for leg length and canal fill. The broach was removed. Broaching then proceeded up to a size 4. This broach was removed. The canal was irrigated with the pulse lavage system. The size 4 stem was then impacted into position with good fixation being obtained.  The 1.5x36 ceramic head was impacted into position after drying the morse taper with a sponge. The final reduction was performed and once again leg lengths and offset were restored radiographically, using the C-arm Excellent functional stability was noted. Final irrigation was done at this time. Exparel was placed in the wound edges. Marcaine with epinepherine, morphine, toradol, and tranexamic acid were then placed in the wound.  The wound was closed in layers. The tensor fascia lata was closed with #1 Vicryl in a running type stitch. Subcutaneous tissue was closed with 2-0 Vicryl in a simple buried stitch, and the skin was closed with a subcuticular 3-0 monocryl followed by the Exofin system. Mepilex dressing was then applied. She tolerated this well, was transferred to the recovery room bed, and taken to recovery room in stable condition. All sponge and needle counts were correct.     Signed By: Shaaron Adler, MD     September 25, 2018

## 2018-09-25 NOTE — Progress Notes (Signed)
Pt transferred to room 206. Pt stable. Dressing CDI. Erica, RN at bedside.        09/25/18 1615   Vitals   Temp 98.1 ??F (36.7 ??C)   Temp Source Axillary   Pulse (Heart Rate) 72   Heart Rate Source Monitor   Resp Rate 14   O2 Sat (%) 100 %   Level of Consciousness Alert   BP 129/71   MAP (Calculated) 90   MEWS Score 0

## 2018-09-25 NOTE — Other (Signed)
Family updated

## 2018-09-25 NOTE — Anesthesia Pre-Procedure Evaluation (Addendum)
Relevant Problems   No relevant active problems       Anesthetic History   No history of anesthetic complications            Review of Systems / Medical History  Patient summary reviewed, nursing notes reviewed and pertinent labs reviewed    Pulmonary          Smoker      Comments: vapes   Neuro/Psych   Within defined limits           Cardiovascular                  Exercise tolerance: >4 METS     GI/Hepatic/Renal  Within defined limits              Endo/Other        Arthritis     Other Findings            Physical Exam    Airway  Mallampati: II  TM Distance: 4 - 6 cm  Neck ROM: normal range of motion   Mouth opening: Normal     Cardiovascular  Regular rate and rhythm,  S1 and S2 normal,  no murmur, click, rub, or gallop             Dental  No notable dental hx       Pulmonary  Breath sounds clear to auscultation               Abdominal  GI exam deferred       Other Findings            Anesthetic Plan    ASA: 2  Anesthesia type: general          Induction: Intravenous  Anesthetic plan and risks discussed with: Patient

## 2018-09-25 NOTE — Progress Notes (Signed)
Problem: Mobility Impaired (Adult and Pediatric)  Goal: *Acute Goals and Plan of Care (Insert Text)  Description  In 1-7 days pt will be able to perform:  STG:  1.  Bed mobility:  Rolling L to R to L modified independent for positioning.  2.  Supine to sit to supine S with HR for meals.  3.  Sit to stand to sit S with RW in prep for ambulation.  LTG:  1.  Gait:  Ambulate >16ft S with RW, WBAT, for home/community mobility.  2.  Stair Negotiation:  Ascend/descend >10 steps CGA with HR for home entry.  3.  Activity tolerance:  Tolerate up in chair 1-2 hours for ADL???s.  4.  Patient/Family Education:  Patient/family to be independent with HEP for follow-up care and safe discharge.   Note:   PHYSICAL THERAPY EVALUATION    Patient: Sierra Schmitt (48 y.o. female)  Date: 09/25/2018  Primary Diagnosis: Osteoarthritis of right hip [M16.11]  Procedure(s) (LRB):  RIGHT TOTAL HIP REPLACEMENT ANTERIOR APPROACH WITH C-ARM (Right) Day of Surgery   Precautions:   Fall, WBAT    ASSESSMENT :  Based on the objective data described below, the patient presents with decreased functional mobility and independence in regard to bed mobility, transfers, gt quality and tolerance, R hip AROM, R hip strength, pain, balance, activity tolerance, stair negotiation and safety due to recent R THA surgery.  Pt rating pain on numerical pain scale 8/10.  Pt required CGA/SBA for supine>sit<>stand.  Pt required vc for safe techniques.  Pt able to participate in gt training w/ SW then switched to RW, WBAT, GB and CGA in hallway w/ antalgic pattern.  Pt able to void on commode and perform own hygiene.    Assisted pt w/ dressing UB and LB.  Pt returned to sitting EOB w/ all needs within reach and ice pack to R hip.   Nurse Alcario Drought aware and friend present.  Recommend HH upon hospital d/c.     Patient will benefit from skilled intervention to address the above impairments.  Patient???s rehabilitation potential is considered to be Good   Factors which may influence rehabilitation potential include:   []          None noted  []          Mental ability/status  []          Medical condition  []          Home/family situation and support systems  []          Safety awareness  [x]          Pain tolerance/management  []          Other:      PLAN :  Recommendations and Planned Interventions:  [x]            Bed Mobility Training             []     Neuromuscular Re-Education  [x]            Transfer Training                   []     Orthotic/Prosthetic Training  [x]            Gait Training                          []     Modalities  [x]            Therapeutic Exercises          []   Edema Management/Control  [x]            Therapeutic Activities            [x]     Patient and Family Training/Education  []            Other (comment):    Frequency/Duration: Patient will be followed by physical therapy twice daily to address goals.  Discharge Recommendations: Home Health  Further Equipment Recommendations for Discharge: RW     SUBJECTIVE:   Patient stated ???I want you.???    OBJECTIVE DATA SUMMARY:     Past Medical History:   Diagnosis Date    Arthritis     osteo in all joints and back    Chronic pain     back and joints    Nicotine vapor product user      Past Surgical History:   Procedure Laterality Date    HX CHOLECYSTECTOMY  2002    HX GI  2005    Hemorrhoidectomy    HX HYSTERECTOMY  2018    HX LUMBAR LAMINECTOMY  2007    HX TUBAL LIGATION  1997     Barriers to Learning/Limitations: None  Compensate with: visual, verbal, tactile, kinesthetic cues/model  Prior Level of Function/Home Situation:   Home Situation  Home Environment: Private residence  # Steps to Enter: 30  Rails to Enter: Yes  Hand Rails : Left  One/Two Story Residence: One story  Living Alone: Yes  Support Systems: Parent, Friends \\ neighbors  Patient Expects to be Discharged to:: Private residence  Current DME Used/Available at Home: Dan Humphreys, Medical laboratory scientific officer, quad  Critical Behavior:   Neurologic State: Alert;Appropriate for age  Orientation Level: Oriented X4  Cognition: Appropriate decision making;Appropriate for age attention/concentration;Appropriate safety awareness;Follows commands  Safety/Judgement: Awareness of environment  Psychosocial  Patient Behaviors: Calm;Cooperative  Skin Condition/Temp: Dry;Warm  Skin Integrity: Incision (comment)(R hip)  Skin Integumentary  Skin Color: Appropriate for ethnicity  Skin Condition/Temp: Dry;Warm  Skin Integrity: Incision (comment)(R hip)  Strength:    Strength: Generally decreased, functional  Tone & Sensation:   Tone: Normal  Sensation: Intact  Range Of Motion:  AROM: Generally decreased, functional  Functional Mobility:  Bed Mobility:  Supine to Sit: Contact guard assistance(vc)  Scooting: Contact guard assistance(vc)  Transfers:  Sit to Stand: Contact guard assistance(vc)  Stand to Sit: Contact guard assistance(vc)  Balance:   Sitting: Intact  Standing: Intact;With support  Ambulation/Gait Training:  Distance (ft): 200 Feet (ft)  Assistive Device: Walker, rolling;Gait belt  Ambulation - Level of Assistance: Contact guard assistance;Stand-by assistance(vc)  Gait Abnormalities: Antalgic;Decreased step clearance  Right Side Weight Bearing: As tolerated  Base of Support: Shift to left  Stance: Right decreased  Speed/Cadence: Slow  Step Length: Left shortened;Right shortened  Swing Pattern: Left asymmetrical;Right asymmetrical  Interventions: Safety awareness training;Tactile cues;Verbal cues;Visual/Demos  Therapeutic Exercises:   Pain:  Pain Scale 1: Numeric (0 - 10)  Pain Intensity 1: 8  Pain Location 1: Hip  Pain Orientation 1: Right  Pain Description 1: Aching  Pain Intervention(s) 1: Relaxation technique;Repositioned;Rest  Activity Tolerance:   Fair   Please refer to the flowsheet for vital signs taken during this treatment.  After treatment:   [x]          Patient left in no apparent distress sitting up EOB   []          Patient left in no apparent distress in bed  [x]          Call bell left  within reach           Nursing notified           Caregiver present           Bed alarm activated    COMMUNICATION/EDUCATION:            Fall prevention education was provided and the patient/caregiver indicated understanding.           Patient/family have participated as able in goal setting and plan of care.           Patient/family agree to work toward stated goals and plan of care.           Patient understands intent and goals of therapy, but is neutral about his/her participation.           Patient is unable to participate in goal setting and plan of care.    Thank you for this referral.  Darnelle Going, PT   Time Calculation: 49 mins       Eval Complexity: History: HIGH Complexity :3+ comorbidities / personal factors will impact the outcome/ POC Exam:MEDIUM Complexity : 3 Standardized tests and measures addressing body structure, function, activity limitation and / or participation in recreation  Presentation: LOW Complexity : Stable, uncomplicated  Clinical Decision Making:Low Complexity    Overall Complexity:LOW

## 2018-09-25 NOTE — Other (Signed)
TRANSFER - OUT REPORT:    Verbal report given to Erica, RN (name) on Sierra Schmitt  being transferred to [address] (unit) for routine post - op       Report consisted of patient???s Situation, Background, Assessment and   Recommendations(SBAR).     Information from the following report(s) SBAR, Kardex, OR Summary, Intake/Output and MAR was reviewed with the receiving nurse.    Lines:   Peripheral IV 09/25/18 Right Hand (Active)   Site Assessment Clean, dry, & intact 09/25/2018  3:17 PM   Phlebitis Assessment 0 09/25/2018  3:17 PM   Infiltration Assessment 0 09/25/2018  3:17 PM   Dressing Status Clean, dry, & intact 09/25/2018  3:17 PM   Dressing Type Transparent;Tape 09/25/2018  3:17 PM   Hub Color/Line Status Green;Infusing 09/25/2018  3:17 PM   Alcohol Cap Used No 09/25/2018  9:21 AM        Opportunity for questions and clarification was provided.      Patient transported with:   O2 @ 2 liters  Registered Nurse

## 2018-09-25 NOTE — Other (Signed)
Erica, RN will review sbar and call back for report

## 2018-09-25 NOTE — Progress Notes (Signed)
 Problem: Mobility Impaired (Adult and Pediatric)  Goal: *Acute Goals and Plan of Care (Insert Text)  Description  In 1-7 days pt will be able to perform:  STG:  1.  Bed mobility:  Rolling L to R to L modified independent for positioning.  2.  Supine to sit to supine S with HR for meals.  3.  Sit to stand to sit S with RW in prep for ambulation.  LTG:  1.  Gait:  Ambulate >118ft S with RW, WBAT, for home/community mobility.  2.  Stair Negotiation:  Ascend/descend >10 steps CGA with HR for home entry.  3.  Activity tolerance:  Tolerate up in chair 1-2 hours for ADL's.  4.  Patient/Family Education:  Patient/family to be independent with HEP for follow-up care and safe discharge.   Note:   PHYSICAL THERAPY EVALUATION    Patient: Sierra Schmitt (48 y.o. female)  Date: 09/25/2018  Primary Diagnosis: Osteoarthritis of right hip [M16.11]  Procedure(s) (LRB):  RIGHT TOTAL HIP REPLACEMENT ANTERIOR APPROACH WITH C-ARM (Right) Day of Surgery   Precautions:   Fall, WBAT    ASSESSMENT :  Based on the objective data described below, the patient presents with decreased functional mobility and independence in regard to bed mobility, transfers, gt quality and tolerance, R hip AROM, R hip strength, pain, balance, activity tolerance, stair negotiation and safety due to recent R THA surgery.  Pt rating pain on numerical pain scale 8/10.  Pt required CGA/SBA for supine>sit<>stand.  Pt required vc for safe techniques.  Pt able to participate in gt training w/ SW then switched to RW, WBAT, GB and CGA in hallway w/ antalgic pattern.  Pt able to void on commode and perform own hygiene.    Assisted pt w/ dressing UB and LB.  Pt returned to sitting EOB w/ all needs within reach and ice pack to R hip.   Nurse Geni aware and friend present.  Recommend HH upon hospital d/c.     Patient will benefit from skilled intervention to address the above impairments.  Patient's rehabilitation potential is considered to be Good  Factors which may influence  rehabilitation potential include:   []          None noted  []          Mental ability/status  []          Medical condition  []          Home/family situation and support systems  []          Safety awareness  [x]          Pain tolerance/management  []          Other:      PLAN :  Recommendations and Planned Interventions:  [x]            Bed Mobility Training             []     Neuromuscular Re-Education  [x]            Transfer Training                   []     Orthotic/Prosthetic Training  [x]            Gait Training                          []     Modalities  [x]            Therapeutic Exercises          []   Edema Management/Control  [x]            Therapeutic Activities            [x]     Patient and Family Training/Education  []            Other (comment):    Frequency/Duration: Patient will be followed by physical therapy twice daily to address goals.  Discharge Recommendations: Home Health  Further Equipment Recommendations for Discharge: RW     SUBJECTIVE:   Patient stated "I want you."    OBJECTIVE DATA SUMMARY:     Past Medical History:   Diagnosis Date    Arthritis     osteo in all joints and back    Chronic pain     back and joints    Nicotine vapor product user      Past Surgical History:   Procedure Laterality Date    HX CHOLECYSTECTOMY  2002    HX GI  2005    Hemorrhoidectomy    HX HYSTERECTOMY  2018    HX LUMBAR LAMINECTOMY  2007    HX TUBAL LIGATION  1997     Barriers to Learning/Limitations: None  Compensate with: visual, verbal, tactile, kinesthetic cues/model  Prior Level of Function/Home Situation:   Home Situation  Home Environment: Private residence  # Steps to Enter: 30  Rails to Enter: Yes  Hand Rails : Left  One/Two Story Residence: One story  Living Alone: Yes  Support Systems: Parent, Friends \ neighbors  Patient Expects to be Discharged to:: Private residence  Current DME Used/Available at Home: Vannie, Medical laboratory scientific officer, quad  Critical Behavior:  Neurologic State: Alert;Appropriate for age  Orientation Level:  Oriented X4  Cognition: Appropriate decision making;Appropriate for age attention/concentration;Appropriate safety awareness;Follows commands  Safety/Judgement: Awareness of environment  Psychosocial  Patient Behaviors: Calm;Cooperative  Skin Condition/Temp: Dry;Warm  Skin Integrity: Incision (comment)(R hip)  Skin Integumentary  Skin Color: Appropriate for ethnicity  Skin Condition/Temp: Dry;Warm  Skin Integrity: Incision (comment)(R hip)  Strength:    Strength: Generally decreased, functional  Tone & Sensation:   Tone: Normal  Sensation: Intact  Range Of Motion:  AROM: Generally decreased, functional  Functional Mobility:  Bed Mobility:  Supine to Sit: Contact guard assistance(vc)  Scooting: Contact guard assistance(vc)  Transfers:  Sit to Stand: Contact guard assistance(vc)  Stand to Sit: Contact guard assistance(vc)  Balance:   Sitting: Intact  Standing: Intact;With support  Ambulation/Gait Training:  Distance (ft): 200 Feet (ft)  Assistive Device: Walker, rolling;Gait belt  Ambulation - Level of Assistance: Contact guard assistance;Stand-by assistance(vc)  Gait Abnormalities: Antalgic;Decreased step clearance  Right Side Weight Bearing: As tolerated  Base of Support: Shift to left  Stance: Right decreased  Speed/Cadence: Slow  Step Length: Left shortened;Right shortened  Swing Pattern: Left asymmetrical;Right asymmetrical  Interventions: Safety awareness training;Tactile cues;Verbal cues;Visual/Demos  Therapeutic Exercises:   Pain:  Pain Scale 1: Numeric (0 - 10)  Pain Intensity 1: 8  Pain Location 1: Hip  Pain Orientation 1: Right  Pain Description 1: Aching  Pain Intervention(s) 1: Relaxation technique;Repositioned;Rest  Activity Tolerance:   Fair   Please refer to the flowsheet for vital signs taken during this treatment.  After treatment:   [x]          Patient left in no apparent distress sitting up EOB  []          Patient left in no apparent distress in bed  [x]          Call bell left within  reach  [x]           Nursing notified  []          Caregiver present  []          Bed alarm activated    COMMUNICATION/EDUCATION:   [x]          Fall prevention education was provided and the patient/caregiver indicated understanding.  [x]          Patient/family have participated as able in goal setting and plan of care.  [x]          Patient/family agree to work toward stated goals and plan of care.  []          Patient understands intent and goals of therapy, but is neutral about his/her participation.  []          Patient is unable to participate in goal setting and plan of care.    Thank you for this referral.  Tedi Horns, PT   Time Calculation: 49 mins       Eval Complexity: History: HIGH Complexity :3+ comorbidities / personal factors will impact the outcome/ POC Exam:MEDIUM Complexity : 3 Standardized tests and measures addressing body structure, function, activity limitation and / or participation in recreation  Presentation: LOW Complexity : Stable, uncomplicated  Clinical Decision Making:Low Complexity    Overall Complexity:LOW

## 2018-09-25 NOTE — Anesthesia Post-Procedure Evaluation (Signed)
Post-Anesthesia Evaluation and Assessment    Cardiovascular Function/Vital Signs  Visit Vitals  BP 126/66   Pulse 72   Temp 36.2 ??C (97.2 ??F)   Resp 19   Ht 5' 8" (1.727 m)   Wt 86.3 kg (190 lb 5 oz)   SpO2 100%   BMI 28.94 kg/m??       Patient is status post Procedure(s):  RIGHT TOTAL HIP REPLACEMENT ANTERIOR APPROACH WITH C-ARM.    Nausea/Vomiting: Controlled.    Postoperative hydration reviewed and adequate.    Pain:  Pain Scale 1: FLACC (09/25/18 1530)  Pain Intensity 1: 0 (09/25/18 1530)   Managed.    Neurological Status:   Neuro (WDL): Within Defined Limits (09/25/18 1513)   At baseline.    Mental Status and Level of Consciousness: Arousable.    Pulmonary Status:   O2 Device: Nasal cannula (09/25/18 1513)   Adequate oxygenation and airway patent.    Complications related to anesthesia: None    Post-anesthesia assessment completed. No concerns.    Patient has met all discharge requirements.    Signed By: Azael Ragain, CRNA    September 25, 2018

## 2018-09-25 NOTE — Progress Notes (Signed)
Pt transferred to room 206. Pt stable. Dressing CDI. Alcario Drought, RN at bedside.        09/25/18 1615   Vitals   Temp 98.1 F (36.7 C)   Temp Source Axillary   Pulse (Heart Rate) 72   Heart Rate Source Monitor   Resp Rate 14   O2 Sat (%) 100 %   Level of Consciousness Alert   BP 129/71   MAP (Calculated) 90   MEWS Score 0

## 2018-09-25 NOTE — Op Note (Signed)
[facility]  Total Left Hip Arthroplasty    Patient: Sierra Schmitt MRN: [id number]  SSN: [id number]    Date of Birth: [birth year]  Age: 48 y.o.  Sex: female      Date of Surgery: 09/25/2018   Preoperative Diagnosis: UNILATERAL PRIMARY OSTEOARTHRITIS RIGHT HIP   Postoperative Diagnosis: UNILATERAL PRIMARY OSTEOARTHRITIS RIGHT HIP   Location: [facility]  Surgeon: Shaaron Adler, MD  Assistant:  Victorio Palm PA - C    Anesthesia: General     Procedure: Total rt Hip Arthroplasty    Findings:  Degenerative joint disease of the rt hip.     Estimated Blood Loss: 450 cc    Specimens: None    Complications: None    Implants:   Implant Name Type Inv. Item Serial No. Manufacturer Lot No. LRB No. Used Action   LINER ACET PINN NEUT 36X56MM -- ALTRX - [id number]  LINER ACET PINN NEUT 36X56MM -- ALTRX  [employer] [id number] Right 1 Implanted   CUP ACET SECTOR GRIPTION -- TI - [id number]  CUP ACET SECTOR GRIPTION -- TI  [employer] [id number] Right 1 Implanted   PLUG ACET APCL H ELIM POS STP --  - [id number]  PLUG ACET APCL H ELIM POS STP --   [employer] [id number] Right 1 Implanted   SCR ACET CANC PINN 6.5X20MM SS --  - [id number]  SCR ACET CANC PINN 6.5X20MM SS --   [employer] [id number] Right 1 Implanted   STEM FEM TAPR SZ4 STD OFFSET -- ACTIS - [id number]  STEM FEM TAPR SZ4 STD OFFSET -- ACTIS  [employer] [id number] Right 1 Implanted   HEAD FEM +1.5MM NK -- BIOLOX DELTA - [id number]  HEAD FEM +1.5MM NK -- BIOLOX DELTA  [employer] [id number] Right 1 Implanted       Procedure Detail:  After the patient was brought to the operating suite, She was effectively anesthetized using general anesthesia, then transferred to the Hana table and secured in a standard fashion. Her rt hip was then prepped with Chloroprep and draped in a normal sterile orthopedic fashion. She was given appropriate intravenous antibiotic preoperatively.  After a proper timeout was performed, a direct anterior approach to the hip was performed using a short Smith-Petersen interval. The incision was placed approximately 3 cm lateral to the anterior superior iliac spine and progressed distally and laterally for a length of approximately 4 inches. Incision was made through the Tensor Fascia Lata with the knife and continued with the Mayo scissors. An Allis clamp was placed on the medial border of the Tensor Fascia Lata and disection continued on the medial border of Tensor Fascia Lata Muscle. Dissection was continued down to the lateral hip capsule. A Cobra retractor was placed on the lateral hip capsule. A Hibbs Retractor was placed distally and a second Cobra retractor was placed on the medial hip capsule. The Lateral Femoral Circumflex Vessels were identified clamped and cauterized. Anterior capsulotomy was performed. Portions of the capsule were removed. The Cobra retractors were then placed on the femoral neck. The degenerative changes of the hip were noted. Femoral neck osteotomy was then performed. The head and neck were removed. The neck length was confirmed with the image intensification. The labrum was excised. The acetabulum was then reamed up to 56 mm with good bleeding bone in all quadrants obtained. The cup was then irrigated with pulse lavage system. A 56 mm Depuy Cluster  hole cup was then impacted in place with excellent stable fixation obtained, placing the cup at about 45 degrees of abduction, 20 degrees of anteversion.  The hole eliminator was placed. one screw was placed. The 36 mm inner diameter polyethylene liner was then impacted in place. Attention was turned to the femur, which was delivered into the wound with a combination of extension, external rotation, and adduction, and using the hook on the Hana table to deliver the femur into the wound. The box osteotome was used followed by the lateralizing broach. The canal was broached up to a size 3.  A trial reduction was then performed with the standard neck offset and 1.5 x 36 mm head trial. This was evaluated for leg length and canal fill. The broach was removed. Broaching then proceeded up to a size 4. This broach was removed. The canal was irrigated with the pulse lavage system. The size 4 stem was then impacted into position with good fixation being obtained.  The 1.5x36 ceramic head was impacted into position after drying the morse taper with a sponge. The final reduction was performed and once again leg lengths and offset were restored radiographically, using the C-arm Excellent functional stability was noted. Final irrigation was done at this time. Exparel was placed in the wound edges. Marcaine with epinepherine, morphine, toradol, and tranexamic acid were then placed in the wound.  The wound was closed in layers. The tensor fascia lata was closed with #1 Vicryl in a running type stitch. Subcutaneous tissue was closed with 2-0 Vicryl in a simple buried stitch, and the skin was closed with a subcuticular 3-0 monocryl followed by the Exofin system. Mepilex dressing was then applied. She tolerated this well, was transferred to the recovery room bed, and taken to recovery room in stable condition. All sponge and needle counts were correct.     Signed By: Shaaron Adler, MD     September 25, 2018

## 2018-09-25 NOTE — Progress Notes (Signed)
Problem: Falls - Risk of  Goal: *Absence of Falls  Description  Document Bridgette Habermann Fall Risk and appropriate interventions in the flowsheet.  Outcome: Progressing Towards Goal  Note: Fall Risk Interventions:  Mobility Interventions: Patient to call before getting OOB         Medication Interventions: Patient to call before getting OOB, Teach patient to arise slowly    Elimination Interventions: Patient to call for help with toileting needs, Call light in reach              Problem: Patient Education: Go to Patient Education Activity  Goal: Patient/Family Education  Outcome: Progressing Towards Goal     Problem: Pain  Goal: *Control of Pain  Outcome: Progressing Towards Goal     Problem: Hip Replacement: Day of Surgery/Unit  Goal: Activity/Safety  Outcome: Progressing Towards Goal  Goal: Consults, if ordered  Outcome: Progressing Towards Goal  Goal: Diagnostic Test/Procedures  Outcome: Progressing Towards Goal  Goal: Nutrition/Diet  Outcome: Progressing Towards Goal  Goal: Medications  Outcome: Progressing Towards Goal  Goal: Respiratory  Outcome: Progressing Towards Goal  Goal: Treatments/Interventions/Procedures  Outcome: Progressing Towards Goal  Goal: Psychosocial  Outcome: Progressing Towards Goal  Goal: *Initiate mobility  Outcome: Progressing Towards Goal  Goal: *Optimal pain control at patient's stated goal  Outcome: Progressing Towards Goal  Goal: *Hemodynamically stable  Outcome: Progressing Towards Goal

## 2018-09-25 NOTE — Interval H&P Note (Signed)
Reviewed PTA medication list with patient/caregiver and patient/caregiver denies any additional medications.     Patient admits to having a responsible adult care for them at home for at least 24 hours after surgery.    Patient encouraged to use gown warming system and informed that using said warming gown to regulate body temperature prior to a procedure has been shown to help reduce the risks of blood clots and infection.    Dual skin assessment & fall risk band verification completed with Aubrey Thacker RN.

## 2018-09-25 NOTE — Interval H&P Note (Signed)
 TRANSFER - OUT REPORT:    Verbal report given to Geni, RN (name) on Sierra Schmitt  being transferred to [address] (unit) for routine post - op       Report consisted of patient's Situation, Background, Assessment and   Recommendations(SBAR).     Information from the following report(s) SBAR, Kardex, OR Summary, Intake/Output and MAR was reviewed with the receiving nurse.    Lines:   Peripheral IV 09/25/18 Right Hand (Active)   Site Assessment Clean, dry, & intact 09/25/2018  3:17 PM   Phlebitis Assessment 0 09/25/2018  3:17 PM   Infiltration Assessment 0 09/25/2018  3:17 PM   Dressing Status Clean, dry, & intact 09/25/2018  3:17 PM   Dressing Type Transparent;Tape 09/25/2018  3:17 PM   Hub Color/Line Status Green;Infusing 09/25/2018  3:17 PM   Alcohol Cap Used No 09/25/2018  9:21 AM        Opportunity for questions and clarification was provided.      Patient transported with:   O2 @ 2 liters  Registered Nurse

## 2018-09-25 NOTE — Progress Notes (Signed)
1920 - Bedside report received from Erin Fulling RN. Patient sitting to side of bed at this time. Family in room.  Pain 7/10. Pt has ambulated and voided.Plan of care for the day addressed with the patient. White board updated. Pt informed to utilize call bell or this RN's zone phone for any questions, concerns, or needs. Pt verbalized understanding. Phone, call bell, and personal items w/in pt reach. Will continue monitor.   2106 PRN Percocet 2 tabs administered per pt request for pain rated 7/10. Will continue to monitor.   2045  - Patient in bed at this time. A/O x 4. IV to R hand  intact and patent.  TEDs and Plexis to BLE. Mepilex dressing to R hip CDI. Pt denies numbness/tingling. Pulses positive, palpable. Lungs clear. Bowel sounds active to all quadrants. Patient able to get to 2500 on the incentive spirometer. Pain 7/10. Pt states she has been having continuous pain for many years. She states she feels some surgical pain to R hip but she is still experiencing pain from L hip and sacrum. Will continue to monitor.   0102 PRN Percocet 2 tabs administered per pt request for pain rated 7/10. Pt ambulated to bathroom, voided, and returned to bed. No s/s of distress noted. Will continue to monitor.   0506 PRN Percocet 2 mg administered per pt request for pain and in anticipation of PT. Pt ambulated through hallways of unit utilizing walker, assisted by this RN. Pt returned to bed. Will continue to monitor.   CHG wipes completed by Particia Nearing RN  Bedside and Verbal shift change report given to [facility] Rn  by Bonne Dolores, RN. Report included the following information SBAR, Kardex, OR Summary, Intake/Output and MAR.

## 2018-09-25 NOTE — Interval H&P Note (Signed)
Unable to contact pt family on phone number provided, pt room assignment is 29

## 2018-09-25 NOTE — Interval H&P Note (Signed)
Erica, RN will review sbar and call back for report

## 2018-09-25 NOTE — Progress Notes (Signed)
1616 - Received patient from PACU in satisfactory condition. VSS. Assumed care of patient. Dual skin assessment completed with Marchelle Folks, RN. No pressure areas noted. Fall risk band in place. Patient is alert and oriented x 4. Patient is calm and cooperative. Denies numbness or tingling to BLE. Pedal pulses palpable and equal bilaterally. Capillary Refill < 3 seconds. Lung sounds clear bilaterally. Respirations even and unlabored. No use of accessory muscles. Educated on incentive spirometer and demonstrated proper use. Abdomen is soft and non-tender. Bowel sounds active to all quadrants. Patient has not voided post-operatively. No bladder distention evident. No complaints of bladder discomfort. Skin is warm, dry and skin color is appropriate to race. Optifoam dressing to right hip noted CDI. No other skin integrity issues present. Ted hose applied to BLE. Sequential compression device applied to BLE. IV intact to right hand and infusing without difficulty. Reports pain 10/10. Patient repositioned in bed with ice packs in place. Patient oriented to call bell use as well as bed use. Patient oriented to phone and how to order meals. Call bell within reach. Bed in low position. Three side rails up.    1722 - PRN Percocet administered for 10/10 pain to right hip. Patient sitting on edge of bed eating with visitor present. Call light in reach.

## 2018-09-25 NOTE — Op Note (Signed)
BRIEF OPERATIVE NOTE    Date of Procedure: 09/25/2018   Preoperative Diagnosis: UNILATERAL PRIMARY OSTEOARTHRITIS RIGHT HIP  Postoperative Diagnosis: UNILATERAL PRIMARY OSTEOARTHRITIS RIGHT HIP    Procedure(s):  RIGHT TOTAL HIP REPLACEMENT ANTERIOR APPROACH WITH C-ARM  Surgeon(s) and Role:     Shaaron Adler, MD - Primary         Surgical Assistant: Victorio Palm, PA-C    Surgical Staff:  Circ-1: Tresa Endo, RN  Physician Assistant: Manson Allan, PA-C  Radiology Technician: Nori Riis, RT, R  Scrub Tech-1: Anda Latina RN-1: Dagmar Hait, RN; Fransico Him, RN  Float Staff: Irven Sula, RN  Event Time In Time Out   Incision Start 1228    Incision Close 1421      Anesthesia: General   Estimated Blood Loss:  Specimens: * No specimens in log *   Findings: Severe DJD   Complications: None  Implants:   Implant Name Type Inv. Item Serial No. Manufacturer Lot No. LRB No. Used Action   LINER ACET PINN NEUT 36X56MM -- ALTRX - [id number]  LINER ACET PINN NEUT 36X56MM -- ALTRX  [employer] [id number] Right 1 Implanted   CUP ACET SECTOR GRIPTION -- TI - [id number]  CUP ACET SECTOR GRIPTION -- TI  [employer] [id number] Right 1 Implanted   PLUG ACET APCL H ELIM POS STP --  - [id number]  PLUG ACET APCL H ELIM POS STP --   [employer] [id number] Right 1 Implanted   SCR ACET CANC PINN 6.5X20MM SS --  - [id number]  SCR ACET CANC PINN 6.5X20MM SS --   [employer] [id number] Right 1 Implanted   STEM FEM TAPR SZ4 STD OFFSET -- ACTIS - [id number]  STEM FEM TAPR SZ4 STD OFFSET -- ACTIS  [employer] [id number] Right 1 Implanted   HEAD FEM +1.5MM NK -- BIOLOX DELTA - [id number]  HEAD FEM +1.5MM NK -- BIOLOX DELTA  [employer] [id number] Right 1 Implanted

## 2018-09-25 NOTE — Interval H&P Note (Signed)
Verbal Report given to Willia Craze, RN

## 2018-09-25 NOTE — Anesthesia Pre-Procedure Evaluation (Signed)
Relevant Problems   No relevant active problems       Anesthetic History   No history of anesthetic complications            Review of Systems / Medical History  Patient summary reviewed, nursing notes reviewed and pertinent labs reviewed    Pulmonary          Smoker      Comments: vapes   Neuro/Psych   Within defined limits           Cardiovascular                  Exercise tolerance: >4 METS     GI/Hepatic/Renal  Within defined limits              Endo/Other        Arthritis     Other Findings            Physical Exam    Airway  Mallampati: II  TM Distance: 4 - 6 cm  Neck ROM: normal range of motion   Mouth opening: Normal     Cardiovascular  Regular rate and rhythm,  S1 and S2 normal,  no murmur, click, rub, or gallop             Dental  No notable dental hx       Pulmonary  Breath sounds clear to auscultation               Abdominal  GI exam deferred       Other Findings            Anesthetic Plan    ASA: 2  Anesthesia type: general          Induction: Intravenous  Anesthetic plan and risks discussed with: Patient

## 2018-09-25 NOTE — Interval H&P Note (Signed)
Family updated

## 2018-09-26 LAB — METABOLIC PANEL, BASIC
Anion gap: 2 mmol/L — ABNORMAL LOW (ref 3.0–18)
BUN/Creatinine ratio: 16 (ref 12–20)
BUN: 11 MG/DL (ref 7.0–18)
CO2: 32 mmol/L (ref 21–32)
Calcium: 8.4 MG/DL — ABNORMAL LOW (ref 8.5–10.1)
Chloride: 107 mmol/L (ref 100–111)
Creatinine: 0.69 MG/DL (ref 0.6–1.3)
GFR est AA: >60 mL/min/{1.73_m2} (ref >60)
GFR est non-AA: >60 mL/min/{1.73_m2} (ref >60)
Glucose: 135 mg/dL — ABNORMAL HIGH (ref 74–99)
Potassium: 4.3 mmol/L (ref 3.5–5.5)
Sodium: 141 mmol/L (ref 136–145)

## 2018-09-26 LAB — HGB & HCT
HCT: 28.2 % — ABNORMAL LOW (ref 35.0–45.0)
HGB: 8.8 g/dL — ABNORMAL LOW (ref 12.0–16.0)

## 2018-09-26 LAB — BASIC METABOLIC PANEL
Anion Gap: 2 mmol/L — ABNORMAL LOW (ref 3.0–18)
BUN: 11 MG/DL (ref 7.0–18)
Bun/Cre Ratio: 16 (ref 12–20)
CO2: 32 mmol/L (ref 21–32)
Calcium: 8.4 MG/DL — ABNORMAL LOW (ref 8.5–10.1)
Chloride: 107 mmol/L (ref 100–111)
Creatinine: 0.69 MG/DL (ref 0.6–1.3)
EGFR IF NonAfrican American: >60 mL/min/{1.73_m2} (ref >60)
GFR African American: >60 mL/min/{1.73_m2} (ref >60)
Glucose: 135 mg/dL — ABNORMAL HIGH (ref 74–99)
Potassium: 4.3 mmol/L (ref 3.5–5.5)
Sodium: 141 mmol/L (ref 136–145)

## 2018-09-26 LAB — HEMOGLOBIN AND HEMATOCRIT
Hematocrit: 28.2 % — ABNORMAL LOW (ref 35.0–45.0)
Hemoglobin: 8.8 g/dL — ABNORMAL LOW (ref 12.0–16.0)

## 2018-09-26 MED ORDER — OXYCODONE 5 MG TAB
5 mg | ORAL | Status: DC | PRN
Start: 2018-09-26 — End: 2018-09-26
  Administered 2018-09-26: 14:00:00 via ORAL

## 2018-09-26 MED ORDER — OXYCODONE-ACETAMINOPHEN 5 MG-325 MG TAB
5-325 mg | ORAL_TABLET | ORAL | 0 refills | Status: AC | PRN
Start: 2018-09-26 — End: 2018-10-01

## 2018-09-26 MED ORDER — ASPIRIN 81 MG TAB, DELAYED RELEASE
81 mg | ORAL_TABLET | Freq: Two times a day (BID) | ORAL | 0 refills | Status: DC
Start: 2018-09-26 — End: 2018-10-17

## 2018-09-26 MED FILL — OXYCODONE 5 MG TAB: 5 mg | ORAL | Qty: 2

## 2018-09-26 MED FILL — ASPIRIN 81 MG TAB, DELAYED RELEASE: 81 mg | ORAL | Qty: 1

## 2018-09-26 MED FILL — FLUARIX QUAD 2019-2020 (PF) 60 MCG (15 MCG X 4)/0.5 ML IM SYRINGE: 60 mcg (15 mcg x 4)/0.5 mL | INTRAMUSCULAR | Qty: 0.5

## 2018-09-26 MED FILL — DOCUSATE SODIUM 100 MG CAP: 100 mg | ORAL | Qty: 1

## 2018-09-26 MED FILL — OXYCODONE-ACETAMINOPHEN 5 MG-325 MG TAB: 5-325 mg | ORAL | Qty: 2

## 2018-09-26 MED FILL — CEFAZOLIN 2 GM/50 ML IN DEXTROSE (ISO-OSMOTIC) IVPB: 2 gram/50 mL | INTRAVENOUS | Qty: 50

## 2018-09-26 NOTE — Discharge Summary (Signed)
Total Hip Discharge Summary    Patient: Sierra Schmitt               Sex: female         MRN: [id number]       Date of Birth:  17-Nov-1970      Age:  48 y.o.        LOS:  LOS: 0 days                DOA: 09/25/2018    Discharge Date: 2018/10/08    Admission Diagnoses: Osteoarthritis of right hip [M16.11]    Discharge Diagnoses:    Problem List as of 10/08/2018 Date Reviewed: 2018-10-08          Codes Class Noted - Resolved    Osteoarthritis of right hip ICD-10-CM: M16.11  ICD-9-CM: 715.95  09/25/2018 - Present              This is a 48 y.o. female with a  history of ongoing hip pain secondary to degenerative joint disease. The patient has failed to respond to conservative care. The option of a total hip arthroplasty, anterior approach was discussed with the patient. Risks and benefits of the procedure were explained to the patient as well as other treatment options and possible surgical outcomes. The patient acknowledged and consent was obtained. The patient was therefore scheduled to undergo a right total hip arthroplasty with Dr. Shaaron Adler. The patient was taken to the operating room for the above-stated procedure. IV antibiotics were given prior to the incision. SCDs were used for DVT prophylaxis. The patient had an estimated intraoperative blood loss of 450 mL. The patient tolerated the procedure well without any complications, and was taken to the recovery room in stable condition. The patient was then transferred to the postoperative orthopedic floor for convalescence, PT, pain management, as well as discharge planning. Physical therapy and occupational therapy initiated their evaluation and treatment and continued to follow the patient until the patient was discharged. Post operative pain was adequately managed with use of oral narcotics prior to discharge. DVT prophylaxis was initiated on the day of surgery including; aspirin, compression stockings and bilateral foot pumps. At the time of discharge,  the patient was able to ambulate safely, go up and down stairs and had an understanding of the explicit discharge precautions and instructions following surgery.  Home Health will come out to assist the patient with this. The patient was discharged to follow up with Dr. Shaaron Adler in approximately 10 to 14 days.     Discharge Condition: Good  DISPOSITION: To home. On the day of discharge the patient was afebrile. Vital signs were stable. The patient was in no acute distress. her Right hip incision was clean, dry, and intact. Extremity was warm and well-perfused, distally neurovascularly intact.    DISCHARGE INSTRUCTIONS:  The patient will be discharged home on Aspirin 81 mg PO BID x 1 month for DVT prophylaxis.  Continue physical therapy for gait training and strengthening. Continue therapeutic exercises. Follow up in 10 to 14 days with Dr. Shaaron Adler.      DISCHARGE MEDICATIONS:   Current Discharge Medication List      START taking these medications    Details   aspirin delayed-release 81 mg tablet Take 1 Tab by mouth two (2) times a day.  Qty: 60 Tab, Refills: 0      oxyCODONE-acetaminophen (PERCOCET) 5-325 mg per tablet Take 1-2 Tabs by mouth every four (4) hours as needed  for Pain for up to 5 days. Max Daily Amount: 12 Tabs.  Qty: 60 Tab, Refills: 0    Associated Diagnoses: S/P hip replacement, right         STOP taking these medications       cyclobenzaprine HCl (FLEXERIL PO) Comments:   Reason for Stopping:         AA8/a-carnit/grap/cocoa/hrb126 (GABADONE PO) Comments:   Reason for Stopping:

## 2018-09-26 NOTE — Progress Notes (Signed)
Not otherwise examined today.  Patient CHG Wipes Applied [name], CNA

## 2018-09-26 NOTE — Progress Notes (Signed)
Progress Note     Patient: Sierra Schmitt MRN: [id number]  SSN: [id number]    Date of Birth: 11/19/1970  Age: 48 y.o.  Sex: female      POD:    1 Day Post-Op  S/P:    Procedure(s):  RIGHT TOTAL HIP REPLACEMENT ANTERIOR APPROACH WITH C-ARM     Subjective:   Pt is with complaints of pain.       Objective:     Patient Vitals for the past 12 hrs:   BP Temp Pulse Resp SpO2   09/26/18 0328 121/58 97.7 ??F (36.5 ??C) 83 16 100 %   09/26/18 0000 126/78 97.5 ??F (36.4 ??C) 81 14 100 %   09/25/18 1939 105/66 97.8 ??F (36.6 ??C) 88 14 98 %     Recent Labs     09/26/18  0318   HGB 8.8*   HCT 28.2*   NA 141   K 4.3   CL 107   CO2 32   BUN 11   CREA 0.69   GLU 135*       Pt resting in bed. Lower extremity operative dressing clean/dry/intact. Neurovascularly intact.     Assessment:     Awake and alert. In good spirits. X rays satisfactory. Probable discharge today.          Plan:   1. Continue pain management/ ice to operative area.  2. Continue to progress with PT/OT.  3. Discharge planning to home with home health

## 2018-09-26 NOTE — Progress Notes (Signed)
OCCUPATIONAL THERAPY EVALUATION/DISCHARGE    Patient: Sierra Schmitt (48 y.o. female)  Date: 09/26/2018  Primary Diagnosis: Osteoarthritis of right hip [M16.11]  Procedure(s) (LRB):  RIGHT TOTAL HIP REPLACEMENT ANTERIOR APPROACH WITH C-ARM (Right) 1 Day Post-Op   Precautions:   Fall, WBAT  PLOF: independent with ADLs and transfers     ASSESSMENT AND RECOMMENDATIONS:  Based on the objective data described below, the patient presents with requiring S for ADLs and transfers following R THA. Pt participate with STS transfers, toilet transfers, toileting, grooming and LB dressing with S during this session. Pt was provided AEs for LB ADLs and demo understanding for the use of same. Detailed discussion about necessary AEs and home management activities was performed and pt verbalized understanding for the same. Pt was left seated at EOB at the end of session in NAD, pain 0/10 .    Skilled occupational therapy is not indicated at this time.  Discharge Recommendations: Home Health  Further Equipment Recommendations for Discharge: transfer bench      SUBJECTIVE:   Patient stated ??? I am doing alright.???    OBJECTIVE DATA SUMMARY:     Past Medical History:   Diagnosis Date    Arthritis     osteo in all joints and back    Chronic pain     back and joints    Nicotine vapor product user      Past Surgical History:   Procedure Laterality Date    HX CHOLECYSTECTOMY  2002    HX GI  2005    Hemorrhoidectomy    HX HYSTERECTOMY  2018    HX LUMBAR LAMINECTOMY  2007    HX TUBAL LIGATION  1997     Barriers to Learning/Limitations: None  Compensate with: visual, verbal, tactile, kinesthetic cues/model    Home Situation:   Home Situation  Home Environment: Apartment  # Steps to Enter: 30  Rails to Enter: Yes  Hand Rails : Left  One/Two Story Residence: One story  Living Alone: Yes  Support Systems: Family member(s), Friends \\ neighbors  Patient Expects to be Discharged to:: Apartment  Current DME Used/Available at Home: Environmental consultant, Medical laboratory scientific officer, quad   Tub or Shower Type: Tub/Shower combination  []      Right hand dominant   []      Left hand dominant    Cognitive/Behavioral Status:  Neurologic State: Alert  Orientation Level: Oriented X4  Cognition: Appropriate for age attention/concentration;Follows commands  Safety/Judgement: Fall prevention    Skin: intact  Edema: none    Vision/Perceptual:    Tracking: Able to track stimulus in all quadrants w/o difficulty    Coordination: BUE  Coordination: Within functional limits  Fine Motor Skills-Upper: Left Intact;Right Intact    Gross Motor Skills-Upper: Left Intact;Right Intact  Balance:  Sitting: Intact  Standing: Intact;With support  Strength: BUE  Strength: Generally decreased, functional  Tone & Sensation: BUE  Tone: Normal  Sensation: Intact  Range of Motion: BUE  AROM: Generally decreased, functional  Functional Mobility and Transfers for ADLs:  Bed Mobility:  Supine to Sit: Supervision  Scooting: Supervision  Transfers:  Sit to Stand: Supervision  Stand to Sit: Supervision   Toilet Transfer : Supervision  ADL Assessment:  Feeding: Independent    Oral Facial Hygiene/Grooming: Supervision    Upper Body Dressing: Independent    Lower Body Dressing: Supervision    Toileting: Supervision  ADL Intervention:  Grooming  Grooming Assistance: Supervision  Position Performed: Standing  Washing Hands: Supervision  Lower Body Dressing Assistance  Dressing Assistance: Supervision  Socks: Supervision  Leg Crossed Method Used: No  Position Performed: Seated edge of bed  Cues: Don;Doff  Adaptive Equipment Used: Reacher;Sock aid    Toileting  Toileting Assistance: Supervision  Bladder Hygiene: Supervision  Clothing Management: Supervision    Cognitive Retraining  Safety/Judgement: Fall prevention    Therapeutic Exercise:    Pain:  Pain level pre-treatment: 0/10   Pain level post-treatment: 0/10   Pain Intervention(s): Medication (see MAR); Rest, Ice, Repositioning   Response to intervention: Nurse notified, See doc flow     Activity Tolerance:   Good   Please refer to the flowsheet for vital signs taken during this treatment.  After treatment:   []   Patient left in no apparent distress sitting up in chair  [x]   Patient left in no apparent distress in bed  [x]   Call bell left within reach  []   Nursing notified  [x]   Caregiver present  []   Bed alarm activated    COMMUNICATION/EDUCATION:   [x]       Role of Occupational Therapy in the acute care setting  [x]       Home safety education was provided and the patient/caregiver indicated understanding.  [x]       Patient/family have participated as able and agree with findings and recommendations.  []       Patient is unable to participate in plan of care at this time.    Thank you for this referral.  Risa Grill, OTR/L  Time Calculation: 34 mins      Eval Complexity: History: LOW Complexity : Brief history review ;   Examination: LOW Complexity : 1-3 performance deficits relating to physical, cognitive , or psychosocial skils that result in activity limitations and / or participation restrictions ;   Decision Making:MEDIUM Complexity : Patient may present with comorbidities that affect occupational performnce. Miniml to moderate modification of tasks or assistance (eg, physical or verbal ) with assesment(s) is necessary to enable patient to complete evaluation

## 2018-09-26 NOTE — Progress Notes (Signed)
Transition of Care (TOC) Plan:    Chart reviewed, met with pt prior to discharge home. FOC offered, pt chose for CM to find [facility] agency who takes her insurance and serves her address. [facility] [phone] able to assist; referral faxed by [facility]. Pt has SW, [facility] agency will forward order for RW to local DME company and will follow up with pt. VM left for pt [phone] to let her know home health agency name.        TOC Transportation:   How is patient being transported at discharge? Family/Friend      When? Once cleared by Therapy between 12-2pm     Is transport scheduled? N/A      Follow-up appointment and transportation:   PCP/Specialist?  See AVS for Appointment         Who is transporting to the follow-up appointment? Family/Friend      Is transport for follow up appointment scheduled? N/A    Communication plan (with patient/family):    Who is being called?  Patient or Next of Kin?  Responsible party?     Patient      What number(s) is to be used?  See Facesheet      What service provider is calling for [facility] services?              When are they calling?  24-48 hours following discharge    Readmission Risk?  (Green/Low; Yellow/Moderate; Red/High):  Green    Care Management Interventions  PCP Verified by CM: Yes  Transition of Care Consult (CM Consult): [place]: No  Reason Outside [place]: Out of service area([facility])  Discharge Durable Medical Equipment: Yes  Physical Therapy Consult: Yes  Occupational Therapy Consult: Yes  Current Support Network: Own Home  Confirm Follow Up Transport: Family  The Plan for Transition of Care is Related to the Following Treatment Goals : HH  The Patient and/or Patient Representative was Provided with a Choice of Provider and Agrees with the Discharge Plan?: Yes  Freedom of Choice List was Provided with Basic Dialogue that Supports the Patient's Individualized Plan of Care/Goals, Treatment Preferences and  Shares the Quality Data Associated with the Providers?: Yes  Discharge Location  Discharge Placement: Home with home health

## 2018-09-26 NOTE — Other (Signed)
Dual AVS reviewed with Bre Clemons RN.  All medications reviewed individually with patient.  Opportunities for questions and concerns provided.  Patient to be discharged via (mode of transport ie. Car, ambulance or air transport) car.  Patient's arm band appropriately discarded.

## 2018-09-26 NOTE — Progress Notes (Signed)
Problem: Mobility Impaired (Adult and Pediatric)  Goal: *Acute Goals and Plan of Care (Insert Text)  Description  In 1-7 days pt will be able to perform:  STG:  1.  Bed mobility:  Rolling L to R to L modified independent for positioning.  2.  Supine to sit to supine S with HR for meals.  3.  Sit to stand to sit S with RW in prep for ambulation.  LTG:  1.  Gait:  Ambulate >142f S with RW, WBAT, for home/community mobility.  2.  Stair Negotiation:  Ascend/descend >10 steps CGA with HR for home entry.  3.  Activity tolerance:  Tolerate up in chair 1-2 hours for ADL???s.  4.  Patient/Family Education:  Patient/family to be independent with HEP for follow-up care and safe discharge.   Outcome: Resolved/Met  Note:   PHYSICAL THERAPY TREATMENT/DISCHARGE    Patient: Sierra Schmitt((48y.o. female)  Date: 09/26/2018  Diagnosis: Osteoarthritis of right hip [M16.11]   <principal problem not specified>  Procedure(s) (LRB):  RIGHT TOTAL HIP REPLACEMENT ANTERIOR APPROACH WITH C-ARM (Right) 1 Day Post-Op  Precautions: Fall, WBAT  Chart, physical therapy assessment, plan of care and goals were reviewed.    ASSESSMENT:  Pt rating pain in R hip 4/10 on numerical pain.  Pt able to participate in HEP, transfer training, gt training and stair training meeting goals for this level of PT.  Pt has met goals for this level of PT.  Pt is ready to transition to HHPT.  Pt left sitting EOB w/ all needs within reach and OT present.  Nurse PRia [employer].  Progression toward goals:  [x]       Goals met  []       Improving appropriately and progressing toward goals  []       Improving slowly and progressing toward goals  []       Not making progress toward goals and plan of care will be adjusted     PLAN:  Patient will be discharged from physical therapy at this time.  Rationale for discharge:  [x]  Goals Achieved  []  Plateau Reached  []  Patient not participating in therapy  []  Other:  Discharge Recommendations:  Home Health   Further Equipment Recommendations for Discharge:  rolling walker     SUBJECTIVE:   Patient stated ???I am ready.???    OBJECTIVE DATA SUMMARY:   Critical Behavior:  Neurologic State: Alert, Appropriate for age  Orientation Level: Oriented X4  Cognition: Appropriate decision making, Appropriate for age attention/concentration, Appropriate safety awareness, Follows commands  Safety/Judgement: Awareness of environment  Functional Mobility Training:  Bed Mobility:  Supine to Sit: Supervision  Scooting: Supervision  Transfers:  Sit to Stand: Supervision  Stand to Sit: Supervision  Balance:  Sitting: Intact  Standing: Intact;With support  Ambulation/Gait Training:  Distance (ft): 220 Feet (ft)  Assistive Device: Walker, rolling  Ambulation - Level of Assistance: Supervision  Gait Abnormalities: Antalgic;Decreased step clearance  Right Side Weight Bearing: As tolerated  Base of Support: Shift to left  Stance: Right decreased  Speed/Cadence: Slow  Step Length: Left shortened;Right shortened  Swing Pattern: Left asymmetrical;Right asymmetrical  Interventions: Safety awareness training;Tactile cues;Verbal cues;Visual/Demos  Stairs:  Number of Stairs Trained: 22  Stairs - Level of Assistance: Stand-by assistance   Rail Use: Left (SBQC R)  Neuro Re-Education:  Therapeutic Exercises:   Instructed and participated in ankle pumps, ankle circles, quad sets, heel slides, LAQ and pillow add squeezes x 10 reps each.  Pain:  Pain  Scale 1: Numeric (0 - 10)  Pain Intensity 1: 4  Pain Location 1: Hip  Pain Orientation 1: Right  Pain Description 1: Aching;Sore  Pain Intervention(s) 1: Medication (see MAR)  Activity Tolerance:   Good   Please refer to the flowsheet for vital signs taken during this treatment.  After treatment:   [x]  Patient left in no apparent distress sitting up EOB  []  Patient left in no apparent distress in bed  [x]  Call bell left within reach  [x]  Nursing notified  [x]  Friend present  []  Bed alarm activated   Betsey Amen, PT   Time Calculation: 27 mins

## 2018-09-26 NOTE — Progress Notes (Signed)
Problem: Falls - Risk of  Goal: *Absence of Falls  Description  Document Schmid Fall Risk and appropriate interventions in the flowsheet.  Outcome: Progressing Towards Goal  Note: Fall Risk Interventions:  Mobility Interventions: Utilize gait belt for transfers/ambulation, Utilize walker, cane, or other assistive device, Strengthening exercises (ROM-active/passive), PT Consult for assist device competence, PT Consult for mobility concerns, Patient to call before getting OOB, OT consult for ADLs         Medication Interventions: Teach patient to arise slowly, Patient to call before getting OOB    Elimination Interventions: Toileting schedule/hourly rounds, Toilet paper/wipes in reach, Stay With Me (per policy), Patient to call for help with toileting needs, Elevated toilet seat, Call light in reach

## 2018-09-26 NOTE — Progress Notes (Addendum)
Assumed care of patient.  Patient is alert and oriented x 4. Patient is calm and cooperative. Patient denies numbness or tingling to all extermities. Capillary Refill less than 3 seconds. Lung sounds clear bilaterally. Respirations even and unlabored. no use of accessory muscles. Abdomen is soft and non-tender. Bowel sounds active to x4 quadrants. Patient has voided post-operatively. No bladder distention evident. No complaints of bladder discomfort. Skin is warm , dry and skin color is appropriate to race. mepilex dressing to R hip noted CDI. no other skin integrity issues present. Ted hose applied to bilaterally. Sequential compression device applied to Bilaterally. 18G IV intact to R hand and infusing without difficulty. Reports pain 5/10. Call bell within reach. Bed in low position. Three side rails up.    0715- Pt ambulated to bathroom    0718-Pt ambulated back into bed. Pt had one occurrence of urine.     0845-Paged Lauren Labra regarding patients pain medication exceeding 4,000 mg of Tylenol.     0853- Telephone order with readback by Lauren Labra PA  for Roxicodone 5-10mg for moderate pain PRN q4    0906-Gave PRN pain medication at this time. Gave patient 10mg of Roxicodone. Pt stated pain was 7/10. Explanation of side effects given. Opportunity for questions given.     0941-Pt ambulating in hallway with PT

## 2018-09-26 NOTE — Progress Notes (Signed)
Oral and Written notification given to patient and/or caregiver informing them that they are currently an Outpatient receiving care in our facility.  Outpatient services include Observation Services under [facility] and [facility] requirements.

## 2018-09-26 NOTE — Progress Notes (Signed)
Progress Note     Patient: Sierra Schmitt MRN: [id number]  SSN: [id number]    Date of Birth: [birth year]  Age: 48 y.o.  Sex: female      POD:    1 Day Post-Op  S/P:    Procedure(s):  RIGHT TOTAL HIP REPLACEMENT ANTERIOR APPROACH WITH C-ARM     Subjective:   Pt is with complaints of some pain that is being controlled by her pain medication. Patient states she has been up to the bathroom a few times and has felt okay. She feels like the leg is "weak" but overall progressing well.       Objective:     Patient Vitals for the past 12 hrs:   BP Temp Pulse Resp SpO2   09/26/18 0802 110/49 98 ??F (36.7 ??C) 83 16 99 %   09/26/18 0328 121/58 97.7 ??F (36.5 ??C) 83 16 100 %   09/26/18 0000 126/78 97.5 ??F (36.4 ??C) 81 14 100 %     Recent Labs     09/26/18  0318   HGB 8.8*   HCT 28.2*   NA 141   K 4.3   CL 107   CO2 32   BUN 11   CREA 0.69   GLU 135*       Pt. resting in bed. Lower extremity operative dressing clean/dry/intact. Neurovascularly intact.      Assessment:     Awake and alert. In good spirits.         Plan:   1. Continue pain management/ ice to operative area.  2. Continue to progress with PT/OT.  3. Discharge planning to home with home health today as long as her pain remains well controlled and she passes PT.

## 2018-09-26 NOTE — Progress Notes (Signed)
Transition of Care (TOC) Plan:    Chart reviewed, met with pt prior to discharge home. FOC offered, pt chose for CM to find [facility] agency who takes her insurance and serves her address. [facility] [phone] able to assist; referral faxed by [facility]. Pt has SW, [facility] agency will forward order for RW to local DME company and will follow up with pt. VM left for pt [phone] to let her know home health agency name.        TOC Transportation:   How is patient being transported at discharge? Family/Friend      When? Once cleared by Therapy between 12-2pm     Is transport scheduled? N/A      Follow-up appointment and transportation:   PCP/Specialist?  See AVS for Appointment         Who is transporting to the follow-up appointment? Family/Friend      Is transport for follow up appointment scheduled? N/A    Communication plan (with patient/family):    Who is being called?  Patient or Next of Kin?  Responsible party?     Patient      What number(s) is to be used?  See Facesheet      What service provider is calling for [facility] services?              When are they calling?  24-48 hours following discharge    Readmission Risk?  (Green/Low; Yellow/Moderate; Red/High):  Green    Care Management Interventions  PCP Verified by CM: Yes  Transition of Care Consult (CM Consult): [place]: No  Reason Outside [place]: Out of service area([facility])  Discharge Durable Medical Equipment: Yes  Physical Therapy Consult: Yes  Occupational Therapy Consult: Yes  Current Support Network: Own Home  Confirm Follow Up Transport: Family  The Plan for Transition of Care is Related to the Following Treatment Goals : HH  The Patient and/or Patient Representative was Provided with a Choice of Provider and Agrees with the Discharge Plan?: Yes  Freedom of Choice List was Provided with Basic Dialogue that Supports the Patient's Individualized Plan of Care/Goals, Treatment Preferences and Shares the Quality Data  Associated with the Providers?: Yes  Discharge Location  Discharge Placement: Home with home health

## 2018-09-26 NOTE — Progress Notes (Signed)
Oral and Written notification given to patient and/or caregiver informing them that they are currently an Outpatient receiving care in our facility.  Outpatient services include Observation Services under [facility] and [facility] requirements.

## 2018-09-26 NOTE — Progress Notes (Signed)
 OCCUPATIONAL THERAPY EVALUATION/DISCHARGE    Patient: Sierra Schmitt (48 y.o. female)  Date: 09/26/2018  Primary Diagnosis: Osteoarthritis of right hip [M16.11]  Procedure(s) (LRB):  RIGHT TOTAL HIP REPLACEMENT ANTERIOR APPROACH WITH C-ARM (Right) 1 Day Post-Op   Precautions:   Fall, WBAT  PLOF: independent with ADLs and transfers     ASSESSMENT AND RECOMMENDATIONS:  Based on the objective data described below, the patient presents with requiring S for ADLs and transfers following R THA. Pt participate with STS transfers, toilet transfers, toileting, grooming and LB dressing with S during this session. Pt was provided AEs for LB ADLs and demo understanding for the use of same. Detailed discussion about necessary AEs and home management activities was performed and pt verbalized understanding for the same. Pt was left seated at EOB at the end of session in NAD, pain 0/10 .    Skilled occupational therapy is not indicated at this time.  Discharge Recommendations: Home Health  Further Equipment Recommendations for Discharge: transfer bench      SUBJECTIVE:   Patient stated " I am doing alright."    OBJECTIVE DATA SUMMARY:     Past Medical History:   Diagnosis Date    Arthritis     osteo in all joints and back    Chronic pain     back and joints    Nicotine vapor product user      Past Surgical History:   Procedure Laterality Date    HX CHOLECYSTECTOMY  2002    HX GI  2005    Hemorrhoidectomy    HX HYSTERECTOMY  2018    HX LUMBAR LAMINECTOMY  2007    HX TUBAL LIGATION  1997     Barriers to Learning/Limitations: None  Compensate with: visual, verbal, tactile, kinesthetic cues/model    Home Situation:   Home Situation  Home Environment: Apartment  # Steps to Enter: 30  Rails to Enter: Yes  Hand Rails : Left  One/Two Story Residence: One story  Living Alone: Yes  Support Systems: Family member(s), Friends \ neighbors  Patient Expects to be Discharged to:: Apartment  Current DME Used/Available at Home: Environmental consultant, Medical laboratory scientific officer, quad  Tub  or Shower Type: Tub/Shower combination  []      Right hand dominant   []      Left hand dominant    Cognitive/Behavioral Status:  Neurologic State: Alert  Orientation Level: Oriented X4  Cognition: Appropriate for age attention/concentration;Follows commands  Safety/Judgement: Fall prevention    Skin: intact  Edema: none    Vision/Perceptual:    Tracking: Able to track stimulus in all quadrants w/o difficulty    Coordination: BUE  Coordination: Within functional limits  Fine Motor Skills-Upper: Left Intact;Right Intact    Gross Motor Skills-Upper: Left Intact;Right Intact  Balance:  Sitting: Intact  Standing: Intact;With support  Strength: BUE  Strength: Generally decreased, functional  Tone & Sensation: BUE  Tone: Normal  Sensation: Intact  Range of Motion: BUE  AROM: Generally decreased, functional  Functional Mobility and Transfers for ADLs:  Bed Mobility:  Supine to Sit: Supervision  Scooting: Supervision  Transfers:  Sit to Stand: Supervision  Stand to Sit: Supervision   Toilet Transfer : Supervision  ADL Assessment:  Feeding: Independent    Oral Facial Hygiene/Grooming: Supervision    Upper Body Dressing: Independent    Lower Body Dressing: Supervision    Toileting: Supervision  ADL Intervention:  Grooming  Grooming Assistance: Supervision  Position Performed: Standing  Washing Hands: Supervision  Lower Body Dressing Assistance  Dressing Assistance: Supervision  Socks: Supervision  Leg Crossed Method Used: No  Position Performed: Seated edge of bed  Cues: Don;Doff  Adaptive Equipment Used: Reacher;Sock aid    Toileting  Toileting Assistance: Supervision  Bladder Hygiene: Supervision  Clothing Management: Supervision    Cognitive Retraining  Safety/Judgement: Fall prevention    Therapeutic Exercise:    Pain:  Pain level pre-treatment: 0/10   Pain level post-treatment: 0/10   Pain Intervention(s): Medication (see MAR); Rest, Ice, Repositioning   Response to intervention: Nurse notified, See doc flow    Activity  Tolerance:   Good   Please refer to the flowsheet for vital signs taken during this treatment.  After treatment:   []   Patient left in no apparent distress sitting up in chair  [x]   Patient left in no apparent distress in bed  [x]   Call bell left within reach  []   Nursing notified  [x]   Caregiver present  []   Bed alarm activated    COMMUNICATION/EDUCATION:   [x]       Role of Occupational Therapy in the acute care setting  [x]       Home safety education was provided and the patient/caregiver indicated understanding.  [x]       Patient/family have participated as able and agree with findings and recommendations.  []       Patient is unable to participate in plan of care at this time.    Thank you for this referral.  Deepal Trivedi, OTR/L  Time Calculation: 34 mins      Eval Complexity: History: LOW Complexity : Brief history review ;   Examination: LOW Complexity : 1-3 performance deficits relating to physical, cognitive , or psychosocial skils that result in activity limitations and / or participation restrictions ;   Decision Making:MEDIUM Complexity : Patient may present with comorbidities that affect occupational performnce. Miniml to moderate modification of tasks or assistance (eg, physical or verbal ) with assesment(s) is necessary to enable patient to complete evaluation

## 2018-09-26 NOTE — Progress Notes (Signed)
Progress Note     Patient: Sierra Schmitt MRN: [id number]  SSN: [id number]    Date of Birth: 12/28/1970  Age: 48 y.o.  Sex: female      POD:    1 Day Post-Op  S/P:    Procedure(s):  RIGHT TOTAL HIP REPLACEMENT ANTERIOR APPROACH WITH C-ARM     Subjective:   Pt is with complaints of pain.       Objective:     Patient Vitals for the past 12 hrs:   BP Temp Pulse Resp SpO2   09/26/18 0328 121/58 97.7 ??F (36.5 ??C) 83 16 100 %   09/26/18 0000 126/78 97.5 ??F (36.4 ??C) 81 14 100 %   09/25/18 1939 105/66 97.8 ??F (36.6 ??C) 88 14 98 %     Recent Labs     09/26/18  0318   HGB 8.8*   HCT 28.2*   NA 141   K 4.3   CL 107   CO2 32   BUN 11   CREA 0.69   GLU 135*       Pt resting in bed. Lower extremity operative dressing clean/dry/intact. Neurovascularly intact.     Assessment:     Awake and alert. In good spirits. X rays satisfactory. Probable discharge today.          Plan:   1. Continue pain management/ ice to operative area.  2. Continue to progress with PT/OT.  3. Discharge planning to home with home health

## 2018-09-26 NOTE — Discharge Summary (Signed)
Total Hip Discharge Summary    Patient: Sierra Schmitt               Sex: female         MRN: [id number]       Date of Birth:  [birth year]      Age:  48 y.o.        LOS:  LOS: 0 days                DOA: 09/25/2018    Discharge Date: Oct 21, 2018    Admission Diagnoses: Osteoarthritis of right hip [M16.11]    Discharge Diagnoses:    Problem List as of 10-21-18 Date Reviewed: 10-21-18          Codes Class Noted - Resolved    Osteoarthritis of right hip ICD-10-CM: M16.11  ICD-9-CM: 715.95  09/25/2018 - Present              This is a 48 y.o. female with a  history of ongoing hip pain secondary to degenerative joint disease. The patient has failed to respond to conservative care. The option of a total hip arthroplasty, anterior approach was discussed with the patient. Risks and benefits of the procedure were explained to the patient as well as other treatment options and possible surgical outcomes. The patient acknowledged and consent was obtained. The patient was therefore scheduled to undergo a right total hip arthroplasty with Dr. Shaaron Adler. The patient was taken to the operating room for the above-stated procedure. IV antibiotics were given prior to the incision. SCDs were used for DVT prophylaxis. The patient had an estimated intraoperative blood loss of 450 mL. The patient tolerated the procedure well without any complications, and was taken to the recovery room in stable condition. The patient was then transferred to the postoperative orthopedic floor for convalescence, PT, pain management, as well as discharge planning. Physical therapy and occupational therapy initiated their evaluation and treatment and continued to follow the patient until the patient was discharged. Post operative pain was adequately managed with use of oral narcotics prior to discharge. DVT prophylaxis was initiated on the day of surgery including; aspirin, compression stockings and bilateral foot pumps. At the time of discharge, the patient was  able to ambulate safely, go up and down stairs and had an understanding of the explicit discharge precautions and instructions following surgery.  Home Health will come out to assist the patient with this. The patient was discharged to follow up with Dr. Shaaron Adler in approximately 10 to 14 days.     Discharge Condition: Good  DISPOSITION: To home. On the day of discharge the patient was afebrile. Vital signs were stable. The patient was in no acute distress. her Right hip incision was clean, dry, and intact. Extremity was warm and well-perfused, distally neurovascularly intact.    DISCHARGE INSTRUCTIONS:  The patient will be discharged home on Aspirin 81 mg PO BID x 1 month for DVT prophylaxis.  Continue physical therapy for gait training and strengthening. Continue therapeutic exercises. Follow up in 10 to 14 days with Dr. Shaaron Adler.      DISCHARGE MEDICATIONS:   Current Discharge Medication List      START taking these medications    Details   aspirin delayed-release 81 mg tablet Take 1 Tab by mouth two (2) times a day.  Qty: 60 Tab, Refills: 0      oxyCODONE-acetaminophen (PERCOCET) 5-325 mg per tablet Take 1-2 Tabs by mouth every four (4) hours as needed  for Pain for up to 5 days. Max Daily Amount: 12 Tabs.  Qty: 60 Tab, Refills: 0    Associated Diagnoses: S/P hip replacement, right         STOP taking these medications       cyclobenzaprine HCl (FLEXERIL PO) Comments:   Reason for Stopping:         AA8/a-carnit/grap/cocoa/hrb126 (GABADONE PO) Comments:   Reason for Stopping:

## 2018-09-26 NOTE — Progress Notes (Signed)
Assumed care of patient.  Patient is alert and oriented x 4. Patient is calm and cooperative. Patient denies numbness or tingling to all extermities. Capillary Refill less than 3 seconds. Lung sounds clear bilaterally. Respirations even and unlabored. no use of accessory muscles. Abdomen is soft and non-tender. Bowel sounds active to x4 quadrants. Patient has voided post-operatively. No bladder distention evident. No complaints of bladder discomfort. Skin is warm , dry and skin color is appropriate to race. mepilex dressing to R hip noted CDI. no other skin integrity issues present. Ted hose applied to bilaterally. Sequential compression device applied to Bilaterally. 18G IV intact to R hand and infusing without difficulty. Reports pain 5/10. Call bell within reach. Bed in low position. Three side rails up.    0715- Pt ambulated to bathroom    0718-Pt ambulated back into bed. Pt had one occurrence of urine.     0845-Paged Victorio Palm regarding patients pain medication exceeding 4,000 mg of Tylenol.     0981- Telephone order with readback by Victorio Palm PA  for Roxicodone 5-10mg  for moderate pain PRN q4    0906-Gave PRN pain medication at this time. Gave patient 10mg  of Roxicodone. Pt stated pain was 7/10. Explanation of side effects given. Opportunity for questions given.     0941-Pt ambulating in hallway with PT

## 2018-09-26 NOTE — Progress Notes (Signed)
Not otherwise examined today.  Patient [name], CNA

## 2018-09-26 NOTE — Progress Notes (Signed)
Problem: Mobility Impaired (Adult and Pediatric)  Goal: *Acute Goals and Plan of Care (Insert Text)  Description  In 1-7 days pt will be able to perform:  STG:  1.  Bed mobility:  Rolling L to R to L modified independent for positioning.  2.  Supine to sit to supine S with HR for meals.  3.  Sit to stand to sit S with RW in prep for ambulation.  LTG:  1.  Gait:  Ambulate >117ft S with RW, WBAT, for home/community mobility.  2.  Stair Negotiation:  Ascend/descend >10 steps CGA with HR for home entry.  3.  Activity tolerance:  Tolerate up in chair 1-2 hours for ADL's.  4.  Patient/Family Education:  Patient/family to be independent with HEP for follow-up care and safe discharge.   Outcome: Resolved/Met  Note:   PHYSICAL THERAPY TREATMENT/DISCHARGE    Patient: Sierra Schmitt (48 y.o. female)  Date: 09/26/2018  Diagnosis: Osteoarthritis of right hip [M16.11]   <principal problem not specified>  Procedure(s) (LRB):  RIGHT TOTAL HIP REPLACEMENT ANTERIOR APPROACH WITH C-ARM (Right) 1 Day Post-Op  Precautions: Fall, WBAT  Chart, physical therapy assessment, plan of care and goals were reviewed.    ASSESSMENT:  Pt rating pain in R hip 4/10 on numerical pain.  Pt able to participate in HEP, transfer training, gt training and stair training meeting goals for this level of PT.  Pt has met goals for this level of PT.  Pt is ready to transition to HHPT.  Pt left sitting EOB w/ all needs within reach and OT present.  Nurse Harlow Ohms aware.  Progression toward goals:  [x]       Goals met  []       Improving appropriately and progressing toward goals  []       Improving slowly and progressing toward goals  []       Not making progress toward goals and plan of care will be adjusted     PLAN:  Patient will be discharged from physical therapy at this time.  Rationale for discharge:  [x]  Goals Achieved  []  Plateau Reached  []  Patient not participating in therapy  []  Other:  Discharge Recommendations:  Home Health  Further Equipment  Recommendations for Discharge:  rolling walker     SUBJECTIVE:   Patient stated "I am ready."    OBJECTIVE DATA SUMMARY:   Critical Behavior:  Neurologic State: Alert, Appropriate for age  Orientation Level: Oriented X4  Cognition: Appropriate decision making, Appropriate for age attention/concentration, Appropriate safety awareness, Follows commands  Safety/Judgement: Awareness of environment  Functional Mobility Training:  Bed Mobility:  Supine to Sit: Supervision  Scooting: Supervision  Transfers:  Sit to Stand: Supervision  Stand to Sit: Supervision  Balance:  Sitting: Intact  Standing: Intact;With support  Ambulation/Gait Training:  Distance (ft): 220 Feet (ft)  Assistive Device: Walker, rolling  Ambulation - Level of Assistance: Supervision  Gait Abnormalities: Antalgic;Decreased step clearance  Right Side Weight Bearing: As tolerated  Base of Support: Shift to left  Stance: Right decreased  Speed/Cadence: Slow  Step Length: Left shortened;Right shortened  Swing Pattern: Left asymmetrical;Right asymmetrical  Interventions: Safety awareness training;Tactile cues;Verbal cues;Visual/Demos  Stairs:  Number of Stairs Trained: 22  Stairs - Level of Assistance: Stand-by assistance   Rail Use: Left (SBQC R)  Neuro Re-Education:  Therapeutic Exercises:   Instructed and participated in ankle pumps, ankle circles, quad sets, heel slides, LAQ and pillow add squeezes x 10 reps each.  Pain:  Pain  Scale 1: Numeric (0 - 10)  Pain Intensity 1: 4  Pain Location 1: Hip  Pain Orientation 1: Right  Pain Description 1: Aching;Sore  Pain Intervention(s) 1: Medication (see MAR)  Activity Tolerance:   Good   Please refer to the flowsheet for vital signs taken during this treatment.  After treatment:   [x]  Patient left in no apparent distress sitting up EOB  []  Patient left in no apparent distress in bed  [x]  Call bell left within reach  [x]  Nursing notified  [x]  Friend present  []  Bed alarm activated  Darnelle Going, PT   Time  Calculation: 27 mins

## 2018-09-26 NOTE — Progress Notes (Signed)
Progress Notes by Manson Allan, PA-C at 09/26/18 3817                Author: Manson Allan, PA-C  Service: Orthopedic Surgery  Author Type: Physician Assistant       Filed: 09/26/18 0805  Date of Service: 09/26/18 0804  Status: Signed           Editor: Jerelyn Scott (Physician Assistant)  Cosigner: Shaaron Adler, MD at 10/02/18 1945                                    Progress Note           Patient: Sierra Schmitt  MRN: [id number]   SSN: [id number]          Date of Birth: 08/11/1970   Age: 48 y.o.   Sex: female         POD:    1 Day Post-Op   S/P:    Procedure(s):   RIGHT TOTAL HIP REPLACEMENT ANTERIOR APPROACH WITH C-ARM         Subjective:     Pt is with complaints  of some pain that  is being controlled by her pain medication. Patient states she has been up to the bathroom a few times and has felt okay. She feels like the leg is "weak" but overall progressing well.           Objective:        Patient Vitals for the past 12 hrs:            BP  Temp  Pulse  Resp  SpO2            09/26/18 0802  110/49  98 ??F (36.7 ??C)  83  16  99 %            09/26/18 0328  121/58  97.7 ??F (36.5 ??C)  83  16  100 %            09/26/18 0000  126/78  97.5 ??F (36.4 ??C)  81  14  100 %          Recent Labs           09/26/18   0318     HGB  8.8*     HCT  28.2*     NA  141     K  4.3     CL  107     CO2  32     BUN  11     CREA  0.69        GLU  135*           Pt. resting in bed. Lower extremity operative dressing  clean/dry/intact. Neurovascularly intact.          Assessment:        Awake and alert. In good spirits.               Plan:     1. Continue pain management / ice to operative area.   2. Continue to progress with PT/OT.   3. Discharge planning to home with home health today as long as her pain remains well controlled and she passes PT.

## 2018-10-06 NOTE — Other (Signed)
Pt denies sleep apnea.  Patient denies previous cardiac testing

## 2018-10-06 NOTE — Other (Signed)
Dr Swenson's office called left message on voicemail of Dr Swenson's team to have H&H repeated prior to surgery, and to fax an order to patient's facility of choice as she resides in [place].

## 2018-10-06 NOTE — Other (Signed)
Operative consent filled out according to MD order on surgical posting, verbatim.

## 2018-10-06 NOTE — Interval H&P Note (Signed)
Dr Solon Augusta office called left message on voicemail of Dr Solon Augusta team to have H&H repeated prior to surgery, and to fax an order to patient's facility of choice as she resides in [place].

## 2018-10-06 NOTE — Interval H&P Note (Signed)
Pt denies sleep apnea.  Patient denies previous cardiac testing

## 2018-10-06 NOTE — Interval H&P Note (Signed)
Operative consent filled out according to MD order on surgical posting, verbatim.

## 2018-10-07 ENCOUNTER — Inpatient Hospital Stay: Payer: MEDICAID

## 2018-10-15 NOTE — H&P (Signed)
[facility] Orthopaedics & Sports Medicine  History and Physical Exam    Patient: Sierra Schmitt MRN: [id number]  SSN: [id number]    Date of Birth: [birth year]  Age: 47 y.o.  Sex: female      Subjective:      Chief Complaint: left hip pain    History of Present Illness:  Patient complains of left hip pain and difficulty ambulating.     Past Medical History:   Diagnosis Date   ??? Arthritis     osteoarthirtis   ??? Chronic pain     back and joints   ??? Nicotine vapor product user      Past Surgical History:   Procedure Laterality Date   ??? HX CHOLECYSTECTOMY  2002   ??? HX GI  2005    Hemorrhoidectomy   ??? HX HYSTERECTOMY  2018   ??? HX LUMBAR LAMINECTOMY  2007   ??? HX TUBAL LIGATION  1997   ??? TOTAL HIP ARTHROPLASTY Right 09/2018     Social History     Occupational History   ??? Not on file   Tobacco Use   ??? Smoking status: Former Smoker     Last attempt to quit: 2012     Years since quitting: 8.2   ??? Smokeless tobacco: Current User   ??? Tobacco comment: vaping, nicotene 5mg .  Patient instructed to stop vaping x 24 hours prior to DOS   Substance and Sexual Activity   ??? Alcohol use: Not Currently   ??? Drug use: Never   ??? Sexual activity: Not Currently     Prior to Admission medications    Medication Sig Start Date End Date Taking? Authorizing Provider   oxyCODONE-acetaminophen (PERCOCET) 5-325 mg per tablet Take 1 Tab by mouth every four (4) hours as needed for Pain.    Provider, Historical   aspirin delayed-release 81 mg tablet Take 1 Tab by mouth two (2) times a day. 09/26/18   Manson Allan, PA-C       Allergies: No Known Allergies     Family History: Non contributory    Review of Systems:  A comprehensive review of systems was negative except for that written in the History of Present Illness.    Objective:       Physical Exam:  HEENT: Normocephalic, atraumatic  Lungs:  Clear to auscultation  Heart:   Regular rate and rhythm  Abdomen: Soft  Extremities:  Pain with range of motion of the left hip   Neurological: Grossly neurovascularly intact    Assessment:      Arthritis of the left hip.    Plan:       The patient has failed previous efforts of conservative management to include non-steroidal anti-inflammatory medications. Due to the fact that conservative efforts failed, the patient became a candidate for surgical intervention.   Proceed with scheduled left total hip arthroplasty, anterior approach.  The various methods of treatment have been discussed with the patient and family. After consideration of risks, benefits, and other options for treatment, the patient has consented to surgical interventions. Questions were answered and preoperative teaching was done by Dr. Gailen Shelter.     Signed By: Manson Allan, PA-C     October 15, 2018

## 2018-10-15 NOTE — H&P (Signed)
[facility] Orthopaedics & Sports Medicine  History and Physical Exam    Patient: Sierra Schmitt MRN: [id number]  SSN: [id number]    Date of Birth: 20-May-1971  Age: 48 y.o.  Sex: female      Subjective:      Chief Complaint: left hip pain    History of Present Illness:  Patient complains of left hip pain and difficulty ambulating.     Past Medical History:   Diagnosis Date   ??? Arthritis     osteoarthirtis   ??? Chronic pain     back and joints   ??? Nicotine vapor product user      Past Surgical History:   Procedure Laterality Date   ??? HX CHOLECYSTECTOMY  2002   ??? HX GI  2005    Hemorrhoidectomy   ??? HX HYSTERECTOMY  2018   ??? HX LUMBAR LAMINECTOMY  2007   ??? HX TUBAL LIGATION  1997   ??? TOTAL HIP ARTHROPLASTY Right 09/2018     Social History     Occupational History   ??? Not on file   Tobacco Use   ??? Smoking status: Former Smoker     Last attempt to quit: 2012     Years since quitting: 8.2   ??? Smokeless tobacco: Current User   ??? Tobacco comment: vaping, nicotene 5mg .  Patient instructed to stop vaping x 24 hours prior to DOS   Substance and Sexual Activity   ??? Alcohol use: Not Currently   ??? Drug use: Never   ??? Sexual activity: Not Currently     Prior to Admission medications    Medication Sig Start Date End Date Taking? Authorizing Provider   oxyCODONE-acetaminophen (PERCOCET) 5-325 mg per tablet Take 1 Tab by mouth every four (4) hours as needed for Pain.    Provider, Historical   aspirin delayed-release 81 mg tablet Take 1 Tab by mouth two (2) times a day. 09/26/18   Manson Allan, PA-C       Allergies: No Known Allergies     Family History: Non contributory    Review of Systems:  A comprehensive review of systems was negative except for that written in the History of Present Illness.    Objective:       Physical Exam:  HEENT: Normocephalic, atraumatic  Lungs:  Clear to auscultation  Heart:   Regular rate and rhythm  Abdomen: Soft  Extremities:  Pain with range of motion of the left hip  Neurological: Grossly  neurovascularly intact    Assessment:      Arthritis of the left hip.    Plan:       The patient has failed previous efforts of conservative management to include non-steroidal anti-inflammatory medications. Due to the fact that conservative efforts failed, the patient became a candidate for surgical intervention.   Proceed with scheduled left total hip arthroplasty, anterior approach.  The various methods of treatment have been discussed with the patient and family. After consideration of risks, benefits, and other options for treatment, the patient has consented to surgical interventions. Questions were answered and preoperative teaching was done by Dr. Gailen Shelter.     Signed By: Manson Allan, PA-C     October 15, 2018

## 2018-10-16 ENCOUNTER — Ambulatory Visit: Admit: 2018-10-16 | Payer: MEDICAID

## 2018-10-16 ENCOUNTER — Inpatient Hospital Stay: Payer: MEDICAID

## 2018-10-16 MED ORDER — BACITRACIN 50,000 UNIT IM
50000 unit | INTRAMUSCULAR | Status: AC
Start: 2018-10-16 — End: ?

## 2018-10-16 MED ORDER — BUPIVACAINE LIPOSOME (PF) 266 MG/20 ML (13.3 MG/ML) SUSP, INFILTRATION
1.3 % (13.3 mg/mL) | Status: AC
Start: 2018-10-16 — End: ?

## 2018-10-16 MED ORDER — MIDAZOLAM 1 MG/ML IJ SOLN
1 mg/mL | INTRAMUSCULAR | Status: DC | PRN
Start: 2018-10-16 — End: 2018-10-16
  Administered 2018-10-16: 16:00:00 via INTRAVENOUS

## 2018-10-16 MED ORDER — HYDROMORPHONE (PF) 1 MG/ML IJ SOLN
1 mg/mL | INTRAMUSCULAR | Status: DC | PRN
Start: 2018-10-16 — End: 2018-10-17

## 2018-10-16 MED ORDER — ONDANSETRON (PF) 4 MG/2 ML INJECTION
4 mg/2 mL | INTRAMUSCULAR | Status: DC | PRN
Start: 2018-10-16 — End: 2018-10-16
  Administered 2018-10-16: 17:00:00 via INTRAVENOUS

## 2018-10-16 MED ORDER — ONDANSETRON (PF) 4 MG/2 ML INJECTION
4 mg/2 mL | INTRAMUSCULAR | Status: AC
Start: 2018-10-16 — End: ?

## 2018-10-16 MED ORDER — MORPHINE 4 MG/ML INTRAVENOUS SOLUTION
4 mg/mL | INTRAVENOUS | Status: AC
Start: 2018-10-16 — End: ?

## 2018-10-16 MED ORDER — KETOROLAC TROMETHAMINE 15 MG/ML INJECTION
15 mg/mL | INTRAMUSCULAR | Status: DC | PRN
Start: 2018-10-16 — End: 2018-10-16
  Administered 2018-10-16: 18:00:00

## 2018-10-16 MED ORDER — PREGABALIN 50 MG CAP
50 mg | ORAL | Status: AC
Start: 2018-10-16 — End: 2018-10-16
  Administered 2018-10-16: 16:00:00 via ORAL

## 2018-10-16 MED ORDER — KETAMINE 50 MG/5 ML (10 MG/ML) IN NS IV SYRINGE
50 mg/5 mL (10 mg/mL) | INTRAVENOUS | Status: AC
Start: 2018-10-16 — End: ?

## 2018-10-16 MED ORDER — ONDANSETRON (PF) 4 MG/2 ML INJECTION
4 mg/2 mL | INTRAMUSCULAR | Status: DC | PRN
Start: 2018-10-16 — End: 2018-10-17

## 2018-10-16 MED ORDER — KETOROLAC TROMETHAMINE 15 MG/ML INJECTION
15 mg/mL | INTRAMUSCULAR | Status: AC
Start: 2018-10-16 — End: ?

## 2018-10-16 MED ORDER — PROPOFOL 10 MG/ML IV EMUL
10 mg/mL | INTRAVENOUS | Status: DC | PRN
Start: 2018-10-16 — End: 2018-10-16
  Administered 2018-10-16: 17:00:00 via INTRAVENOUS

## 2018-10-16 MED ORDER — DEXAMETHASONE SODIUM PHOSPHATE 4 MG/ML IJ SOLN
4 mg/mL | INTRAMUSCULAR | Status: DC | PRN
Start: 2018-10-16 — End: 2018-10-16
  Administered 2018-10-16: 17:00:00 via INTRAVENOUS

## 2018-10-16 MED ORDER — TRANEXAMIC ACID 1,000 MG/10 ML (100 MG/ML) IV
1000 mg/10 mL (100 mg/mL) | INTRAVENOUS | Status: AC
Start: 2018-10-16 — End: ?

## 2018-10-16 MED ORDER — ASPIRIN 81 MG TAB, DELAYED RELEASE
81 mg | Freq: Two times a day (BID) | ORAL | Status: DC
Start: 2018-10-16 — End: 2018-10-17
  Administered 2018-10-17 (×2): via ORAL

## 2018-10-16 MED ORDER — LIDOCAINE (PF) 20 MG/ML (2 %) IJ SOLN
20 mg/mL (2 %) | INTRAMUSCULAR | Status: DC | PRN
Start: 2018-10-16 — End: 2018-10-16
  Administered 2018-10-16: 17:00:00 via INTRAVENOUS

## 2018-10-16 MED ORDER — SODIUM CHLORIDE 0.9 % IJ SYRG
Freq: Three times a day (TID) | INTRAMUSCULAR | Status: DC
Start: 2018-10-16 — End: 2018-10-17
  Administered 2018-10-17 (×3): via INTRAVENOUS

## 2018-10-16 MED ORDER — DEXMEDETOMIDINE 100 MCG/ML IV SOLN
100 mcg/mL | INTRAVENOUS | Status: AC
Start: 2018-10-16 — End: ?

## 2018-10-16 MED ORDER — OXYCODONE-ACETAMINOPHEN 5 MG-325 MG TAB
5-325 mg | Freq: Once | ORAL | Status: DC | PRN
Start: 2018-10-16 — End: 2018-10-16

## 2018-10-16 MED ORDER — BUPIVACAINE-EPINEPHRINE (PF) 0.5 %-1:200,000 IJ SOLN
0.5 %-1:200,000 | INTRAMUSCULAR | Status: AC
Start: 2018-10-16 — End: ?

## 2018-10-16 MED ORDER — SODIUM CHLORIDE 0.9 % INJECTION
INTRAMUSCULAR | Status: AC
Start: 2018-10-16 — End: ?

## 2018-10-16 MED ORDER — EPHEDRINE 50 MG/5 ML (10 MG/ML) IN NS IV SYRINGE
50510 mg/5 mL (10 mg/mL) | INTRAVENOUS | Status: AC
Start: 2018-10-16 — End: ?

## 2018-10-16 MED ORDER — POLYMYXIN B SULFATE 500,000 UNIT IJ SOLR
500000 unit | INTRAMUSCULAR | Status: AC
Start: 2018-10-16 — End: ?

## 2018-10-16 MED ORDER — ONDANSETRON (PF) 4 MG/2 ML INJECTION
4 mg/2 mL | Freq: Once | INTRAMUSCULAR | Status: DC
Start: 2018-10-16 — End: 2018-10-16
  Administered 2018-10-16: 20:00:00 via INTRAVENOUS

## 2018-10-16 MED ORDER — FENTANYL CITRATE (PF) 50 MCG/ML IJ SOLN
50 mcg/mL | INTRAMUSCULAR | Status: DC | PRN
Start: 2018-10-16 — End: 2018-10-16
  Administered 2018-10-16 (×3): via INTRAVENOUS

## 2018-10-16 MED ORDER — SODIUM CHLORIDE 0.9 % IJ SYRG
INTRAMUSCULAR | Status: DC | PRN
Start: 2018-10-16 — End: 2018-10-17

## 2018-10-16 MED ORDER — SODIUM CHLORIDE 0.9 % IV
INTRAVENOUS | Status: DC | PRN
Start: 2018-10-16 — End: 2018-10-16
  Administered 2018-10-16: 17:00:00

## 2018-10-16 MED ORDER — EPINEPHRINE (PF) 1 MG/ML INJECTION
1 mg/mL ( mL) | INTRAMUSCULAR | Status: AC
Start: 2018-10-16 — End: ?

## 2018-10-16 MED ORDER — CEFAZOLIN 2 GM/50 ML IN DEXTROSE (ISO-OSMOTIC) IVPB
2 gram/50 mL | Freq: Three times a day (TID) | INTRAVENOUS | Status: AC
Start: 2018-10-16 — End: 2018-10-17
  Administered 2018-10-17 (×2): via INTRAVENOUS

## 2018-10-16 MED ORDER — OXYCODONE-ACETAMINOPHEN 5 MG-325 MG TAB
5-325 mg | ORAL | Status: DC | PRN
Start: 2018-10-16 — End: 2018-10-17
  Administered 2018-10-16 – 2018-10-17 (×6): via ORAL

## 2018-10-16 MED ORDER — GLYCOPYRROLATE 0.2 MG/ML IJ SOLN
0.2 mg/mL | INTRAMUSCULAR | Status: DC | PRN
Start: 2018-10-16 — End: 2018-10-16
  Administered 2018-10-16: 17:00:00 via INTRAVENOUS

## 2018-10-16 MED ORDER — NALOXONE 0.4 MG/ML INJECTION
0.4 mg/mL | INTRAMUSCULAR | Status: DC | PRN
Start: 2018-10-16 — End: 2018-10-17

## 2018-10-16 MED ORDER — ROCURONIUM 10 MG/ML IV
10 mg/mL | INTRAVENOUS | Status: AC
Start: 2018-10-16 — End: ?

## 2018-10-16 MED ORDER — SUGAMMADEX 100 MG/ML INTRAVENOUS SOLUTION
100 mg/mL | INTRAVENOUS | Status: DC | PRN
Start: 2018-10-16 — End: 2018-10-16
  Administered 2018-10-16: 19:00:00 via INTRAVENOUS

## 2018-10-16 MED ORDER — SUGAMMADEX 100 MG/ML INTRAVENOUS SOLUTION
100 mg/mL | INTRAVENOUS | Status: AC
Start: 2018-10-16 — End: ?

## 2018-10-16 MED ORDER — LACTATED RINGERS IV
INTRAVENOUS | Status: DC
Start: 2018-10-16 — End: 2018-10-17
  Administered 2018-10-16 – 2018-10-17 (×2): via INTRAVENOUS

## 2018-10-16 MED ORDER — DOCUSATE SODIUM 100 MG CAP
100 mg | Freq: Two times a day (BID) | ORAL | Status: DC | PRN
Start: 2018-10-16 — End: 2018-10-17

## 2018-10-16 MED ORDER — LIDOCAINE (PF) 20 MG/ML (2 %) IJ SOLN
20 mg/mL (2 %) | INTRAMUSCULAR | Status: AC
Start: 2018-10-16 — End: ?

## 2018-10-16 MED ORDER — HYDROMORPHONE (PF) 2 MG/ML IJ SOLN
2 mg/mL | INTRAMUSCULAR | Status: DC | PRN
Start: 2018-10-16 — End: 2018-10-16
  Administered 2018-10-16 (×2): via INTRAVENOUS

## 2018-10-16 MED ORDER — MIDAZOLAM (PF) 1 MG/ML INJECTION SOLUTION
1 mg/mL | INTRAMUSCULAR | Status: AC
Start: 2018-10-16 — End: ?

## 2018-10-16 MED ORDER — ROCURONIUM 10 MG/ML IV
10 mg/mL | INTRAVENOUS | Status: DC | PRN
Start: 2018-10-16 — End: 2018-10-16
  Administered 2018-10-16 (×4): via INTRAVENOUS

## 2018-10-16 MED ORDER — PROPOFOL 10 MG/ML IV EMUL
10 mg/mL | INTRAVENOUS | Status: AC
Start: 2018-10-16 — End: ?

## 2018-10-16 MED ORDER — DIPHENHYDRAMINE 25 MG CAP
25 mg | ORAL | Status: DC | PRN
Start: 2018-10-16 — End: 2018-10-17
  Administered 2018-10-16 – 2018-10-17 (×3): via ORAL

## 2018-10-16 MED ORDER — FLUMAZENIL 0.1 MG/ML IV SOLN
0.1 mg/mL | INTRAVENOUS | Status: DC | PRN
Start: 2018-10-16 — End: 2018-10-16

## 2018-10-16 MED ORDER — DEXAMETHASONE SODIUM PHOSPHATE 4 MG/ML IJ SOLN
4 mg/mL | INTRAMUSCULAR | Status: AC
Start: 2018-10-16 — End: ?

## 2018-10-16 MED ORDER — SODIUM CHLORIDE 0.9 % IV
1.3 % (13.3 mg/mL) | INTRAVENOUS | Status: DC | PRN
Start: 2018-10-16 — End: 2018-10-16
  Administered 2018-10-16: 18:00:00

## 2018-10-16 MED ORDER — LACTATED RINGERS IV
INTRAVENOUS | Status: DC
Start: 2018-10-16 — End: 2018-10-17
  Administered 2018-10-16 (×3): via INTRAVENOUS

## 2018-10-16 MED ORDER — HYDROMORPHONE (PF) 1 MG/ML IJ SOLN
1 mg/mL | INTRAMUSCULAR | Status: AC
Start: 2018-10-16 — End: ?

## 2018-10-16 MED ORDER — FENTANYL CITRATE (PF) 50 MCG/ML IJ SOLN
50 mcg/mL | INTRAMUSCULAR | Status: DC | PRN
Start: 2018-10-16 — End: 2018-10-16

## 2018-10-16 MED ORDER — ACETAMINOPHEN 500 MG TAB
500 mg | Freq: Once | ORAL | Status: AC
Start: 2018-10-16 — End: 2018-10-16
  Administered 2018-10-16: 16:00:00 via ORAL

## 2018-10-16 MED ORDER — EPHEDRINE 50 MG/5 ML (10 MG/ML) IN NS IV SYRINGE
50 mg/5 mL (10 mg/mL) | INTRAVENOUS | Status: DC | PRN
Start: 2018-10-16 — End: 2018-10-16
  Administered 2018-10-16 (×2): via INTRAVENOUS

## 2018-10-16 MED ORDER — LACTATED RINGERS IV
INTRAVENOUS | Status: DC
Start: 2018-10-16 — End: 2018-10-16
  Administered 2018-10-16: 20:00:00 via INTRAVENOUS

## 2018-10-16 MED ORDER — FENTANYL CITRATE (PF) 50 MCG/ML IJ SOLN
50 mcg/mL | INTRAMUSCULAR | Status: AC
Start: 2018-10-16 — End: ?

## 2018-10-16 MED ORDER — TRANEXAMIC ACID 1,000 MG/100 ML(10 MG/ML) IN (ISO-OSMOTIC) SODIUM CHLORIDE IVPB
1000 mg/100 mL (10 mg/mL) | Freq: Once | INTRAVENOUS | Status: AC
Start: 2018-10-16 — End: 2018-10-16
  Administered 2018-10-16: 17:00:00 via INTRAVENOUS

## 2018-10-16 MED ORDER — POLYMYXIN B SULFATE 500,000 UNIT IJ SOLR
500000 unit | INTRAMUSCULAR | Status: DC | PRN
Start: 2018-10-16 — End: 2018-10-16
  Administered 2018-10-16: 17:00:00

## 2018-10-16 MED ORDER — HYDROMORPHONE (PF) 1 MG/ML IJ SOLN
1 mg/mL | INTRAMUSCULAR | Status: DC | PRN
Start: 2018-10-16 — End: 2018-10-16
  Administered 2018-10-16: 18:00:00 via INTRAVENOUS

## 2018-10-16 MED ORDER — KETAMINE 10 MG/ML IJ SOLN
10 mg/mL | INTRAMUSCULAR | Status: DC | PRN
Start: 2018-10-16 — End: 2018-10-16
  Administered 2018-10-16 (×3): via INTRAVENOUS

## 2018-10-16 MED ORDER — NALOXONE 0.4 MG/ML INJECTION
0.4 mg/mL | INTRAMUSCULAR | Status: DC | PRN
Start: 2018-10-16 — End: 2018-10-16

## 2018-10-16 MED ORDER — GLYCOPYRROLATE 0.2 MG/ML IJ SOLN
0.2 mg/mL | INTRAMUSCULAR | Status: AC
Start: 2018-10-16 — End: ?

## 2018-10-16 MED ORDER — CEFAZOLIN 2 GM/50 ML IN DEXTROSE (ISO-OSMOTIC) IVPB
2 gram/50 mL | Freq: Once | INTRAVENOUS | Status: AC
Start: 2018-10-16 — End: 2018-10-16
  Administered 2018-10-16: 17:00:00 via INTRAVENOUS

## 2018-10-16 MED ORDER — CELECOXIB 100 MG CAP
100 mg | ORAL | Status: AC
Start: 2018-10-16 — End: 2018-10-16
  Administered 2018-10-16: 16:00:00 via ORAL

## 2018-10-16 MED ORDER — TRANEXAMIC ACID 1,000 MG/10 ML (100 MG/ML) IV
1000 mg/10 mL (100 mg/mL) | Freq: Once | INTRAVENOUS | Status: DC
Start: 2018-10-16 — End: 2018-10-16
  Administered 2018-10-16: 14:00:00 via INTRA_ARTICULAR

## 2018-10-16 MED FILL — LYRICA 50 MG CAPSULE: 50 mg | ORAL | Qty: 1

## 2018-10-16 MED FILL — BRIDION 100 MG/ML INTRAVENOUS SOLUTION: 100 mg/mL | INTRAVENOUS | Qty: 2

## 2018-10-16 MED FILL — BACITRACIN 50,000 UNIT IM: 50000 unit | INTRAMUSCULAR | Qty: 150000

## 2018-10-16 MED FILL — CEFAZOLIN 2 GM/50 ML IN DEXTROSE (ISO-OSMOTIC) IVPB: 2 gram/50 mL | INTRAVENOUS | Qty: 1

## 2018-10-16 MED FILL — HYDROMORPHONE (PF) 1 MG/ML IJ SOLN: 1 mg/mL | INTRAMUSCULAR | Qty: 1

## 2018-10-16 MED FILL — EPINEPHRINE (PF) 1 MG/ML INJECTION: 1 mg/mL ( mL) | INTRAMUSCULAR | Qty: 3

## 2018-10-16 MED FILL — PROPOFOL 10 MG/ML IV EMUL: 10 mg/mL | INTRAVENOUS | Qty: 20

## 2018-10-16 MED FILL — KETAMINE 50 MG/5 ML (10 MG/ML) IN NS IV SYRINGE: 50 mg/5 mL (10 mg/mL) | INTRAVENOUS | Qty: 5

## 2018-10-16 MED FILL — MORPHINE 4 MG/ML INTRAVENOUS SOLUTION: 4 mg/mL | INTRAVENOUS | Qty: 1

## 2018-10-16 MED FILL — DIPHENHYDRAMINE 25 MG CAP: 25 mg | ORAL | Qty: 1

## 2018-10-16 MED FILL — POLYMYXIN B SULFATE 500,000 UNIT IJ SOLR: 500000 unit | INTRAMUSCULAR | Qty: 1500000

## 2018-10-16 MED FILL — ONDANSETRON (PF) 4 MG/2 ML INJECTION: 4 mg/2 mL | INTRAMUSCULAR | Qty: 2

## 2018-10-16 MED FILL — KETOROLAC TROMETHAMINE 15 MG/ML INJECTION: 15 mg/mL | INTRAMUSCULAR | Qty: 2

## 2018-10-16 MED FILL — TRANEXAMIC ACID 1,000 MG/10 ML (100 MG/ML) IV: 1000 mg/10 mL (100 mg/mL) | INTRAVENOUS | Qty: 10

## 2018-10-16 MED FILL — ROCURONIUM 10 MG/ML IV: 10 mg/mL | INTRAVENOUS | Qty: 5

## 2018-10-16 MED FILL — DEXMEDETOMIDINE 100 MCG/ML IV SOLN: 100 mcg/mL | INTRAVENOUS | Qty: 2

## 2018-10-16 MED FILL — FENTANYL CITRATE (PF) 50 MCG/ML IJ SOLN: 50 mcg/mL | INTRAMUSCULAR | Qty: 2

## 2018-10-16 MED FILL — SENSORCAINE-MPF/EPINEPHRINE 0.5 %-1:200,000 INJECTION SOLUTION: 0.5 %-1:200,000 | INTRAMUSCULAR | Qty: 30

## 2018-10-16 MED FILL — SODIUM CHLORIDE 0.9 % INJECTION: INTRAMUSCULAR | Qty: 40

## 2018-10-16 MED FILL — DEXAMETHASONE SODIUM PHOSPHATE 4 MG/ML IJ SOLN: 4 mg/mL | INTRAMUSCULAR | Qty: 1

## 2018-10-16 MED FILL — OXYCODONE-ACETAMINOPHEN 5 MG-325 MG TAB: 5-325 mg | ORAL | Qty: 2

## 2018-10-16 MED FILL — HYDROMORPHONE (PF) 2 MG/ML IJ SOLN: 2 mg/mL | INTRAMUSCULAR | Qty: 1

## 2018-10-16 MED FILL — GLYCOPYRROLATE 0.2 MG/ML IJ SOLN: 0.2 mg/mL | INTRAMUSCULAR | Qty: 2

## 2018-10-16 MED FILL — XYLOCAINE-MPF 20 MG/ML (2 %) INJECTION SOLUTION: 20 mg/mL (2 %) | INTRAMUSCULAR | Qty: 10

## 2018-10-16 MED FILL — EPHEDRINE 50 MG/5 ML (10 MG/ML) IN NS IV SYRINGE: 50 mg/5 mL (10 mg/mL) | INTRAVENOUS | Qty: 5

## 2018-10-16 MED FILL — MIDAZOLAM (PF) 1 MG/ML INJECTION SOLUTION: 1 mg/mL | INTRAMUSCULAR | Qty: 2

## 2018-10-16 MED FILL — MAPAP EXTRA STRENGTH 500 MG TABLET: 500 mg | ORAL | Qty: 2

## 2018-10-16 MED FILL — EXPAREL (PF) 1.3 % (13.3 MG/ML) SUSPENSION FOR LOCAL INFILTRATION: 1.3 % (13.3 mg/mL) | Qty: 20

## 2018-10-16 MED FILL — BD POSIFLUSH NORMAL SALINE 0.9 % INJECTION SYRINGE: INTRAMUSCULAR | Qty: 40

## 2018-10-16 MED FILL — LACTATED RINGERS IV: INTRAVENOUS | Qty: 1000

## 2018-10-16 MED FILL — CELECOXIB 100 MG CAP: 100 mg | ORAL | Qty: 4

## 2018-10-16 NOTE — Other (Signed)
Bedside and Verbal shift change report given to Nicky RN by Shavonna Bryant, RN. Report included the following information SBAR, Kardex, OR Summary, Intake/Output and MAR.

## 2018-10-16 NOTE — Op Note (Signed)
[facility] Orthopaedics & Sports Medicine  Total Left Hip Arthroplasty    Patient: Sierra Schmitt MRN: [id number]  SSN: [id number]    Date of Birth: 1970/10/21  Age: 48 y.o.  Sex: female      Date of Surgery: 10/16/2018   Preoperative Diagnosis: UNILATERAL PRIMARY OSTEOARTHRITIS,LEFT HIP   Postoperative Diagnosis: UNILATERAL PRIMARY OSTEOARTHRITIS,LEFT HIP   Location: [facility]  Surgeon: Shaaron Adler, MD  Assistant:  Victorio Palm PA - C    Anesthesia: General     Procedure: Total lt Hip Arthroplasty    Findings:  Degenerative joint disease of the lt hip.     Estimated Blood Loss: 550 cc    Specimens: None    Complications: None    Implants:   Implant Name Type Inv. Item Serial No. Manufacturer Lot No. LRB No. Used Action   CUP ACET SECTOR GRIPTION -- TI - [id number]  CUP ACET SECTOR GRIPTION -- TI  [employer] [id number] Left 1 Implanted   LINER ACET PINN NEUT 36X56MM -- ALTRX - [id number]  LINER ACET PINN NEUT 36X56MM -- ALTRX  [employer] [id number] Left 1 Implanted   SCR ACET CANC PINN 6.5X25MM SS --  - [id number]  SCR ACET CANC PINN 6.5X25MM SS --   [employer] [id number] Left 1 Implanted   STEM FEM TAPR SZ4 STD OFFSET -- ACTIS - [id number]  STEM FEM TAPR SZ4 STD OFFSET -- ACTIS  [employer] [id number] Left 1 Implanted   HEAD FEM CER ARTC EZ 36 +12 -- BIOLOX DELTA - [id number]  HEAD FEM CER ARTC EZ 36 +12 -- BIOLOX DELTA  [employer] [id number] Left 1 Implanted       Procedure Detail:  After the patient was brought to the operating suite, She was effectively anesthetized using general anesthesia, then transferred to the Hana table and secured in a standard fashion. Her lt hip was then prepped with Chloroprep and draped in a normal sterile orthopedic fashion. She was given appropriate intravenous antibiotic preoperatively. After a proper timeout was performed, a direct anterior approach to the hip was performed  using a short Smith-Petersen interval. The incision was placed approximately 3 cm lateral to the anterior superior iliac spine and progressed distally and laterally for a length of approximately 4 inches. Incision was made through the Tensor Fascia Lata with the knife and continued with the Mayo scissors. An Allis clamp was placed on the medial border of the Tensor Fascia Lata and disection continued on the medial border of Tensor Fascia Lata Muscle. Dissection was continued down to the lateral hip capsule. A Cobra retractor was placed on the lateral hip capsule. A Hibbs Retractor was placed distally and a second Cobra retractor was placed on the medial hip capsule. The Lateral Femoral Circumflex Vessels were identified clamped and cauterized. Anterior capsulotomy was performed. Portions of the capsule were removed. The Cobra retractors were then placed on the femoral neck. The degenerative changes of the hip were noted. Femoral neck osteotomy was then performed. The head and neck were removed. The neck length was confirmed with the image intensification. The labrum was excised. The acetabulum was then reamed up to 56 mm with good bleeding bone in all quadrants obtained. The cup was then irrigated with pulse lavage system. A 56 mm Depuy Cluster hole cup was then impacted in place with excellent stable fixation obtained, placing the cup at about 45 degrees of abduction, 20 degrees of anteversion.  The hole eliminator  was placed. one screw was placed. The 36 mm inner diameter ceramic liner was then impacted in place. Attention was turned to the femur, which was delivered into the wound with a combination of extension, external rotation, and adduction, and using the hook on the Hana table to deliver the femur into the wound. The box osteotome was used followed by the lateralizing broach. The canal was broached up to a size 4. A trial reduction was then performed with the standard neck offset and  1.5 mm head trial. This was evaluated for leg length and canal fill. The broach was removed. The canal was irrigated with the pulse lavage system. The size 4 stem was then impacted into position with good fixation being obtained.  The +12x36 ceramic head was impacted into position after drying the morse taper with a sponge. The final reduction was performed and once again leg lengths and offset were evaluated radiographically, using the C-arm. The leg was lengthened somewhat as desired based on pre op discussion with patient. Excellent functional stability was noted. Final irrigation was done at this time. Exparel was placed in the wound edges. Marcaine with epinepherine, morphine, toradol, and tranexamic acid were then placed in the wound.  The wound was closed in layers. The tensor fascia lata was closed with #1 Vicryl in a running type stitch. Subcutaneous tissue was closed with 2-0 Vicryl in a simple buried stitch, and the skin was closed with a subcuticular 3-0 monocryl followed by the Exofin system. Mepilex dressing was then applied. She tolerated this well, was transferred to the recovery room bed, and taken to recovery room in stable condition. All sponge and needle counts were correct.     Signed By: Shaaron Adler, MD     October 16, 2018

## 2018-10-16 NOTE — Other (Signed)
Shavonna reviewing sbar.

## 2018-10-16 NOTE — Progress Notes (Signed)
Problem: Falls - Risk of  Goal: *Absence of Falls  Description: Document Schmid Fall Risk and appropriate interventions in the flowsheet.  Outcome: Progressing Towards Goal  Note: Fall Risk Interventions:  Mobility Interventions: Patient to call before getting OOB         Medication Interventions: Patient to call before getting OOB, Teach patient to arise slowly    Elimination Interventions: Call light in reach

## 2018-10-16 NOTE — Anesthesia Post-Procedure Evaluation (Signed)
Procedure(s):  LEFT HIP:  TOTAL HIP REPLACEMENT ANTERIOR APPROACH W/C-ARM, " SPEC POP".    general    Anesthesia Post Evaluation        Comments: Post-Anesthesia Evaluation and Assessment    Cardiovascular Function/Vital Signs  BP 112/70    Pulse 89    Temp 37.6 ??C (99.7 ??F)    Resp 17    Ht 5' 8"  (1.727 m)    Wt 85.1 kg (187 lb 9 oz)    SpO2 100%    BMI 28.52 kg/m??     Patient is status post Procedure(s):  LEFT HIP:  TOTAL HIP REPLACEMENT ANTERIOR APPROACH W/C-ARM, " SPEC POP".    Nausea/Vomiting: Controlled.    Postoperative hydration reviewed and adequate.    Pain:  Pain Scale 1: FLACC (10/16/18 1531)  Pain Intensity 1: 0 (10/16/18 1531)   Managed.    Neurological Status:   Neuro (WDL): Within Defined Limits (10/16/18 1017)   At baseline.    Mental Status and Level of Consciousness: Arousable.    Pulmonary Status:   O2 Device: Nasal cannula (10/16/18 1520)   Adequate oxygenation and airway patent.    Complications related to anesthesia: None    Post-anesthesia assessment completed. No concerns.    Patient has met all discharge requirements.    Signed By: Maryan Char, MD    October 16, 2018                   Vitals Value Taken Time   BP 112/70 10/16/2018  3:35 PM   Temp 37.6 ??C (99.7 ??F) 10/16/2018  3:07 PM   Pulse 87 10/16/2018  3:36 PM   Resp 15 10/16/2018  3:36 PM   SpO2 100 % 10/16/2018  3:36 PM   Vitals shown include unvalidated device data.

## 2018-10-16 NOTE — Progress Notes (Signed)
Problem: Mobility Impaired (Adult and Pediatric)  Goal: *Acute Goals and Plan of Care (Insert Text)  Description: In 1-7 days pt will be able to perform:  STG:  1.  Bed mobility:  Rolling L to R to L modified independent for positioning.  2.  Supine to sit to supine S with HR for meals.  3.  Sit to stand to sit S with RW in prep for ambulation.  LTG:  1.  Gait:  Ambulate >140ft S with RW, WBAT, for home/community mobility.  2.  Stair Negotiation:  Ascend/descend >10 steps CGA with HR for home entry.  3.  Activity tolerance:  Tolerate up in chair 1-2 hours for ADL???s.  4.  Patient/Family Education:  Patient/family to be independent with HEP for follow-up care and safe discharge.   Note:   PHYSICAL THERAPY EVALUATION    Patient: Sierra Schmitt (48 y.o. female)  Date: 10/16/2018  Primary Diagnosis: Osteoarthritis of left hip [M16.12]  Procedure(s) (LRB):  LEFT HIP:  TOTAL HIP REPLACEMENT ANTERIOR APPROACH W/C-ARM, " SPEC POP" (Left) Day of Surgery   Precautions:   Fall, WBAT    ASSESSMENT :  Based on the objective data described below, the patient presents with decreased functional mobility and independence in regard to bed mobility, transfers, gt quality and tolerance, L hip AROM, L hip strength, pain, balance, activity tolerance, stair negotiation and safety due to recent L THA surgery.  Pt rating pain on numerical pain scale 8/10.  Pt required CGA for supine<>sit<>stand.  Pt required vc for safe techniques.  Pt able to participate in gt training w/ RW, WBAT, GB and CGA w/ antalgic pattern.  Pt able to void on commode and perform own hygiene.    Pt returned to supine in bed w/ all needs within reach, SCDs applied and ice pack to L hip.   Nurse Loma Messing aware.  Recommend HH upon hospital d/c.    Patient will benefit from skilled intervention to address the above impairments.  Patient???s rehabilitation potential is considered to be Good  Factors which may influence rehabilitation potential include:    []          None noted  []          Mental ability/status  []          Medical condition  []          Home/family situation and support systems  []          Safety awareness  [x]          Pain tolerance/management  []          Other:      PLAN :  Recommendations and Planned Interventions:  [x]            Bed Mobility Training             []     Neuromuscular Re-Education  [x]            Transfer Training                   []     Orthotic/Prosthetic Training  [x]            Gait Training                          []     Modalities  [x]            Therapeutic Exercises          []     Edema  Management/Control             Therapeutic Activities                Patient and Family Training/Education             Other (comment):    Frequency/Duration: Patient will be followed by physical therapy twice daily to address goals.  Discharge Recommendations: Home Health  Further Equipment Recommendations for Discharge: N/A     SUBJECTIVE:   Patient stated ???I want to wait a month and then get my knees done.???    OBJECTIVE DATA SUMMARY:     Past Medical History:   Diagnosis Date    Arthritis     osteoarthirtis    Chronic pain     back and joints    Nicotine vapor product user      Past Surgical History:   Procedure Laterality Date    HX CHOLECYSTECTOMY  2002    HX GI  2005    Hemorrhoidectomy    HX HYSTERECTOMY  2018    HX LUMBAR LAMINECTOMY  2007    HX TUBAL LIGATION  1997    TOTAL HIP ARTHROPLASTY Right 09/2018     Barriers to Learning/Limitations: None  Compensate with: visual, verbal, tactile, kinesthetic cues/model  Prior Level of Function/Home Situation:   Home Situation  Home Environment: Apartment  # Steps to Enter: 32  Rails to Enter: Yes  Hand Rails : Right  One/Two Story Residence: One story  Living Alone: Yes  Support Systems: Friends \\ neighbors  Patient Expects to be Discharged to:: Apartment  Current DME Used/Available at Home: Dan Humphreys, Medical laboratory scientific officer, straight  Critical Behavior:  Neurologic State: Alert;Appropriate for age   Orientation Level: Oriented X4  Cognition: Appropriate decision making;Appropriate for age attention/concentration;Appropriate safety awareness;Follows commands  Safety/Judgement: Awareness of environment  Psychosocial  Patient Behaviors: Calm;Cooperative  Purposeful Interaction: Yes  Pt Identified Daily Priority: Clinical issues (comment)  Caritas Process: Nurture loving kindness  Caring Interventions: Reassure  Reassure: Informing;Therapeutic listening  Skin Condition/Temp: Dry;Warm  Skin Integrity: Incision (comment)(L hip)  Skin Integumentary  Skin Color: Appropriate for ethnicity  Skin Condition/Temp: Dry;Warm  Skin Integrity: Incision (comment)(L hip)  Turgor: Non-tenting  Hair Growth: Present  Varicosities: Absent  Strength:    Strength: Generally decreased, functional  Tone & Sensation:   Tone: Normal  Sensation: Intact  Range Of Motion:  AROM: Generally decreased, functional  Functional Mobility:  Bed Mobility:  Supine to Sit: Contact guard assistance(vc)  Sit to Supine: Stand-by assistance(vc)  Scooting: Contact guard assistance(vc)  Transfers:  Sit to Stand: Contact guard assistance(vc)  Stand to Sit: Contact guard assistance(vc)  Balance:   Sitting: Intact  Standing: Intact;With support  Ambulation/Gait Training:  Distance (ft): 32 Feet (ft)  Assistive Device: Walker, rolling;Gait belt  Ambulation - Level of Assistance: Contact guard assistance(vc)  Gait Abnormalities: Antalgic;Decreased step clearance;Step to gait  Left Side Weight Bearing: As tolerated  Base of Support: Shift to right  Stance: Left decreased  Speed/Cadence: Slow  Step Length: Left shortened;Right shortened  Swing Pattern: Left asymmetrical;Right asymmetrical  Interventions: Safety awareness training;Tactile cues;Verbal cues;Visual/Demos  Therapeutic Exercises:   Encouraged ankle pumps, quad sets and heel slides  Pain:  Pain Scale 1: Numeric (0 - 10)  Pain Intensity 1: 8  Pain Location 1: Hip  Pain Orientation 1: Left   Pain Description 1: Aching  Pain Intervention(s) 1: Medication (see MAR)  Activity Tolerance:   Fair   Please refer to the flowsheet  for vital signs taken during this treatment.  After treatment:   []          Patient left in no apparent distress sitting up in chair  [x]          Patient left in no apparent distress in bed  [x]          Call bell left within reach  [x]          Nursing notified  []          Caregiver present  []          Bed alarm activated    COMMUNICATION/EDUCATION:   [x]          Fall prevention education was provided and the patient/caregiver indicated understanding.  [x]          Patient/family have participated as able in goal setting and plan of care.  [x]          Patient/family agree to work toward stated goals and plan of care.  []          Patient understands intent and goals of therapy, but is neutral about his/her participation.  []          Patient is unable to participate in goal setting and plan of care.    Thank you for this referral.  Darnelle Going, PT   Time Calculation: 40 mins       Eval Complexity: History: HIGH Complexity :3+ comorbidities / personal factors will impact the outcome/ POC Exam:MEDIUM Complexity : 3 Standardized tests and measures addressing body structure, function, activity limitation and / or participation in recreation  Presentation: LOW Complexity : Stable, uncomplicated  Clinical Decision Making:Low Complexity    Overall Complexity:LOW

## 2018-10-16 NOTE — Interval H&P Note (Signed)
H&P Update:  Sierra Schmitt was seen and examined.  History and physical has been reviewed. The patient has been examined. There have been no significant clinical changes since the completion of the originally dated History and Physical.  Patient identified by surgeon; surgical site was confirmed by patient and surgeon.  Pt has scoliosis and even though her legs are equal in length she uses a 1 inch lift in her left shoe to help her back feel better.  We discussed in the office and again today that we will make the left leg longer to diminish the amount of shoe lift she needs.  She understands that there is a risk of damage to the sciatic nerve with leg lengthening.

## 2018-10-16 NOTE — Other (Signed)
Reviewed PTA medication list with patient/caregiver and patient/caregiver denies any additional medications.     Dual skin assessment completed by F Anderson RN and K SMith RN.

## 2018-10-16 NOTE — Other (Signed)
No repeat H&H needed per Dr Swenson.

## 2018-10-16 NOTE — Other (Signed)
Helen to inform pt. And family that Sx may come to take into the OR in 20 min or so.

## 2018-10-16 NOTE — Other (Signed)
TRANSFER - OUT REPORT:    Verbal report given to Shavonna(name) on Sierra Schmitt  being transferred to 2b(unit) for routine post - op       Report consisted of patient???s Situation, Background, Assessment and   Recommendations(SBAR).     Information from the following report(s) SBAR, Kardex, OR Summary, Procedure Summary, Intake/Output and MAR was reviewed with the receiving nurse.    Lines:   Peripheral IV 10/16/18 Right Cephalic (Active)   Site Assessment Clean, dry, & intact 10/16/2018  3:23 PM   Phlebitis Assessment 0 10/16/2018  3:23 PM   Infiltration Assessment 0 10/16/2018  3:23 PM   Dressing Status Clean, dry, & intact 10/16/2018  3:23 PM   Dressing Type Transparent;Tape 10/16/2018  3:23 PM   Hub Color/Line Status Infusing;Green 10/16/2018 10:06 AM   Alcohol Cap Used No 10/16/2018 10:06 AM        Opportunity for questions and clarification was provided.      Patient transported with:   Registered Nurse  Tech

## 2018-10-16 NOTE — Brief Op Note (Signed)
BRIEF OPERATIVE NOTE    Date of Procedure: 10/16/2018   Preoperative Diagnosis: UNILATERAL PRIMARY OSTEOARTHRITIS,LEFT HIP  Postoperative Diagnosis: UNILATERAL PRIMARY OSTEOARTHRITIS,LEFT HIP    Procedure(s):  LEFT HIP:  TOTAL HIP REPLACEMENT ANTERIOR APPROACH W/C-ARM, " SPEC POP"  Surgeon(s) and Role:     * Swenson, Jon, MD - Primary         Surgical Assistant: Atanacio Melnyk, PA-C    Surgical Staff:  Circ-1: Lewis, Karen B, RN  Circ-Relief: Clark, Patricia L, RN  Physician Assistant: Melburn Treiber M, PA-C  Radiology Technician: Manton, Jessica M  Scrub Tech-1: Puryear, [place]  Scrub Tech-2: Whitney, Colleen B  Surg Asst-1: Duman, Katrina M  Event Time In   Incision Start 1256   Incision Close 1451     Anesthesia: General   Estimated Blood Loss: 550mL  Specimens: * No specimens in log *   Findings: Severe DJD   Complications: None  Implants:   Implant Name Type Inv. Item Serial No. Manufacturer Lot No. LRB No. Used Action   CUP ACET SECTOR GRIPTION 56MM -- TI - [id number]  CUP ACET SECTOR GRIPTION 56MM -- TI  [employer] [id number] Left 1 Implanted   LINER ACET PINN NEUT 36X56MM -- ALTRX - [id number]  LINER ACET PINN NEUT 36X56MM -- ALTRX  [employer] [id number] Left 1 Implanted   SCR ACET CANC PINN 6.5X25MM SS --  - [id number]  SCR ACET CANC PINN 6.5X25MM SS --   [employer] [id number] Left 1 Implanted   STEM FEM TAPR SZ4 STD OFFSET -- ACTIS - [id number]  STEM FEM TAPR SZ4 STD OFFSET -- ACTIS  [employer] [id number] Left 1 Implanted   HEAD FEM CER ARTC EZ 36 +12 -- BIOLOX DELTA - [id number]  HEAD FEM CER ARTC EZ 36 +12 -- BIOLOX DELTA  [employer] [id number] Left 1 Implanted

## 2018-10-16 NOTE — Progress Notes (Addendum)
Head to toe assessment completed at this time. Pt denies chest pain or SOB. Pt denies any numbness or tingling to extremities. Fall risk band in place. Pt educated on side effects of medication and the use of the incentive spirometer. Pt encouraged to manage pain and call for assistance. Pt left in bed with call light within reach, bed in low position and wheels locked. Will continue to monitor.    Pt left in the room no signs of distress. Pt educated on side effects of medication and use of incentive spirometer. Pt pain managed by PRN medication per MAR. All Pt's concerns addressed.

## 2018-10-16 NOTE — Anesthesia Pre-Procedure Evaluation (Signed)
Relevant Problems   No relevant active problems       Anesthetic History   No history of anesthetic complications            Review of Systems / Medical History  Patient summary reviewed, nursing notes reviewed and pertinent labs reviewed    Pulmonary          Smoker    Pertinent negatives: No COPD, asthma, recent URI and sleep apnea  Comments: Vaporized nicotine - did not abstain DOS   Neuro/Psych   Within defined limits           Cardiovascular  Within defined limits                Exercise tolerance: >4 METS     GI/Hepatic/Renal  Within defined limits              Endo/Other        Arthritis  Pertinent negatives: No diabetes, hypothyroidism, hyperthyroidism and obesity   Other Findings              Physical Exam    Airway  Mallampati: II  TM Distance: 4 - 6 cm  Neck ROM: normal range of motion   Mouth opening: Normal     Cardiovascular  Regular rate and rhythm,  S1 and S2 normal,  no murmur, click, rub, or gallop             Dental  No notable dental hx       Pulmonary  Breath sounds clear to auscultation               Abdominal  GI exam deferred       Other Findings            Anesthetic Plan    ASA: 2  Anesthesia type: general          Induction: Intravenous  Anesthetic plan and risks discussed with: Patient and Family      GA/LMA

## 2018-10-16 NOTE — Progress Notes (Addendum)
1623 - Patient arrives to unit at this time. Admission completed at this time. Patient is A/O x 3. IV to right hand intact and patent.  Teds and SCDS applied bilateral.  Foam dressing to left hip CDI. Denies numbness/tingling. Pedal pulses palpable. Pain 10/10. Fall risk band on patient. Patient was oriented to the room to include use of call bell, meal ordering, and use of incentive spirometer patient able to get up to 4000 on IS. Patient was given explanation of " up for dinner" program and has verbalized understanding. Bed in lowest position. Phone and call bell left within reach. Patient educated on need to call for assistance before getting OOB.  Plan of care for the day addressed with patient. Educated on pain medication availability and possible side effects.

## 2018-10-16 NOTE — Progress Notes (Signed)
Problem: Falls - Risk of  Goal: *Absence of Falls  Description  Document Schmid Fall Risk and appropriate interventions in the flowsheet.  Outcome: Progressing Towards Goal  Note: Fall Risk Interventions:  Mobility Interventions: Patient to call before getting OOB         Medication Interventions: Patient to call before getting OOB, Teach patient to arise slowly    Elimination Interventions: Call light in reach

## 2018-10-16 NOTE — Interval H&P Note (Signed)
No repeat H&H needed per Dr Gailen Shelter.

## 2018-10-16 NOTE — Anesthesia Post-Procedure Evaluation (Signed)
Procedure(s):  LEFT HIP:  TOTAL HIP REPLACEMENT ANTERIOR APPROACH W/C-ARM, " SPEC POP".    general    Anesthesia Post Evaluation        Comments: Post-Anesthesia Evaluation and Assessment    Cardiovascular Function/Vital Signs  BP 112/70    Pulse 89    Temp 37.6 ??C (99.7 ??F)    Resp 17    Ht 5' 8" (1.727 m)    Wt 85.1 kg (187 lb 9 oz)    SpO2 100%    BMI 28.52 kg/m??     Patient is status post Procedure(s):  LEFT HIP:  TOTAL HIP REPLACEMENT ANTERIOR APPROACH W/C-ARM, " SPEC POP".    Nausea/Vomiting: Controlled.    Postoperative hydration reviewed and adequate.    Pain:  Pain Scale 1: FLACC (10/16/18 1531)  Pain Intensity 1: 0 (10/16/18 1531)   Managed.    Neurological Status:   Neuro (WDL): Within Defined Limits (10/16/18 1017)   At baseline.    Mental Status and Level of Consciousness: Arousable.    Pulmonary Status:   O2 Device: Nasal cannula (10/16/18 1520)   Adequate oxygenation and airway patent.    Complications related to anesthesia: None    Post-anesthesia assessment completed. No concerns.    Patient has met all discharge requirements.    Signed By: Crawford Tamura, MD    October 16, 2018                   Vitals Value Taken Time   BP 112/70 10/16/2018  3:35 PM   Temp 37.6 ??C (99.7 ??F) 10/16/2018  3:07 PM   Pulse 87 10/16/2018  3:36 PM   Resp 15 10/16/2018  3:36 PM   SpO2 100 % 10/16/2018  3:36 PM   Vitals shown include unvalidated device data.

## 2018-10-16 NOTE — Interval H&P Note (Signed)
Sierra Schmitt reviewing sbar.

## 2018-10-16 NOTE — Progress Notes (Signed)
 1623 - Patient arrives to unit at this time. Admission completed at this time. Patient is A/O x 3. IV to right hand intact and patent.  Teds and SCDS applied bilateral.  Foam dressing to left hip CDI. Denies numbness/tingling. Pedal pulses palpable. Pain 10/10. Fall risk band on patient. Patient was oriented to the room to include use of call bell, meal ordering, and use of incentive spirometer patient able to get up to 4000 on IS. Patient was given explanation of  up for dinner program and has verbalized understanding. Bed in lowest position. Phone and call bell left within reach. Patient educated on need to call for assistance before getting OOB.  Plan of care for the day addressed with patient. Educated on pain medication availability and possible side effects.

## 2018-10-16 NOTE — Progress Notes (Signed)
 Problem: Mobility Impaired (Adult and Pediatric)  Goal: *Acute Goals and Plan of Care (Insert Text)  Description: In 1-7 days pt will be able to perform:  STG:  1.  Bed mobility:  Rolling L to R to L modified independent for positioning.  2.  Supine to sit to supine S with HR for meals.  3.  Sit to stand to sit S with RW in prep for ambulation.  LTG:  1.  Gait:  Ambulate >198ft S with RW, WBAT, for home/community mobility.  2.  Stair Negotiation:  Ascend/descend >10 steps CGA with HR for home entry.  3.  Activity tolerance:  Tolerate up in chair 1-2 hours for ADL's.  4.  Patient/Family Education:  Patient/family to be independent with HEP for follow-up care and safe discharge.   Note:   PHYSICAL THERAPY EVALUATION    Patient: Sierra Schmitt (48 y.o. female)  Date: 10/16/2018  Primary Diagnosis: Osteoarthritis of left hip [M16.12]  Procedure(s) (LRB):  LEFT HIP:  TOTAL HIP REPLACEMENT ANTERIOR APPROACH W/C-ARM,  SPEC POP (Left) Day of Surgery   Precautions:   Fall, WBAT    ASSESSMENT :  Based on the objective data described below, the patient presents with decreased functional mobility and independence in regard to bed mobility, transfers, gt quality and tolerance, L hip AROM, L hip strength, pain, balance, activity tolerance, stair negotiation and safety due to recent L THA surgery.  Pt rating pain on numerical pain scale 8/10.  Pt required CGA for supine<>sit<>stand.  Pt required vc for safe techniques.  Pt able to participate in gt training w/ RW, WBAT, GB and CGA w/ antalgic pattern.  Pt able to void on commode and perform own hygiene.    Pt returned to supine in bed w/ all needs within reach, SCDs applied and ice pack to L hip.   Nurse Jeanelle aware.  Recommend HH upon hospital d/c.    Patient will benefit from skilled intervention to address the above impairments.  Patient's rehabilitation potential is considered to be Good  Factors which may influence rehabilitation potential include:   []          None  noted  []          Mental ability/status  []          Medical condition  []          Home/family situation and support systems  []          Safety awareness  [x]          Pain tolerance/management  []          Other:      PLAN :  Recommendations and Planned Interventions:  [x]            Bed Mobility Training             []     Neuromuscular Re-Education  [x]            Transfer Training                   []     Orthotic/Prosthetic Training  [x]            Gait Training                          []     Modalities  [x]            Therapeutic Exercises          []     Edema  Management/Control  [x]            Therapeutic Activities            [x]     Patient and Family Training/Education  []            Other (comment):    Frequency/Duration: Patient will be followed by physical therapy twice daily to address goals.  Discharge Recommendations: Home Health  Further Equipment Recommendations for Discharge: N/A     SUBJECTIVE:   Patient stated "I want to wait a month and then get my knees done."    OBJECTIVE DATA SUMMARY:     Past Medical History:   Diagnosis Date    Arthritis     osteoarthirtis    Chronic pain     back and joints    Nicotine vapor product user      Past Surgical History:   Procedure Laterality Date    HX CHOLECYSTECTOMY  2002    HX GI  2005    Hemorrhoidectomy    HX HYSTERECTOMY  2018    HX LUMBAR LAMINECTOMY  2007    HX TUBAL LIGATION  1997    TOTAL HIP ARTHROPLASTY Right 09/2018     Barriers to Learning/Limitations: None  Compensate with: visual, verbal, tactile, kinesthetic cues/model  Prior Level of Function/Home Situation:   Home Situation  Home Environment: Apartment  # Steps to Enter: 32  Rails to Enter: Yes  Hand Rails : Right  One/Two Story Residence: One story  Living Alone: Yes  Support Systems: Friends \ neighbors  Patient Expects to be Discharged to:: Apartment  Current DME Used/Available at Home: [employer]annie, Medical laboratory scientific officer, straight  Critical Behavior:  Neurologic State: Alert;Appropriate for age  Orientation Level:  Oriented X4  Cognition: Appropriate decision making;Appropriate for age attention/concentration;Appropriate safety awareness;Follows commands  Safety/Judgement: Awareness of environment  Psychosocial  Patient Behaviors: Calm;Cooperative  Purposeful Interaction: Yes  Pt Identified Daily Priority: Clinical issues (comment)  Caritas Process: Nurture loving kindness  Caring Interventions: Reassure  Reassure: Informing;Therapeutic listening  Skin Condition/Temp: Dry;Warm  Skin Integrity: Incision (comment)(L hip)  Skin Integumentary  Skin Color: Appropriate for ethnicity  Skin Condition/Temp: Dry;Warm  Skin Integrity: Incision (comment)(L hip)  Turgor: Non-tenting  Hair Growth: Present  Varicosities: Absent  Strength:    Strength: Generally decreased, functional  Tone & Sensation:   Tone: Normal  Sensation: Intact  Range Of Motion:  AROM: Generally decreased, functional  Functional Mobility:  Bed Mobility:  Supine to Sit: Contact guard assistance(vc)  Sit to Supine: Stand-by assistance(vc)  Scooting: Contact guard assistance(vc)  Transfers:  Sit to Stand: Contact guard assistance(vc)  Stand to Sit: Contact guard assistance(vc)  Balance:   Sitting: Intact  Standing: Intact;With support  Ambulation/Gait Training:  Distance (ft): 32 Feet (ft)  Assistive Device: Walker, rolling;Gait belt  Ambulation - Level of Assistance: Contact guard assistance(vc)  Gait Abnormalities: Antalgic;Decreased step clearance;Step to gait  Left Side Weight Bearing: As tolerated  Base of Support: Shift to right  Stance: Left decreased  Speed/Cadence: Slow  Step Length: Left shortened;Right shortened  Swing Pattern: Left asymmetrical;Right asymmetrical  Interventions: Safety awareness training;Tactile cues;Verbal cues;Visual/Demos  Therapeutic Exercises:   Encouraged ankle pumps, quad sets and heel slides  Pain:  Pain Scale 1: Numeric (0 - 10)  Pain Intensity 1: 8  Pain Location 1: Hip  Pain Orientation 1: Left  Pain Description 1: Aching  Pain  Intervention(s) 1: Medication (see MAR)  Activity Tolerance:   Fair   Please refer to the flowsheet  for vital signs taken during this treatment.  After treatment:   []          Patient left in no apparent distress sitting up in chair  [x]          Patient left in no apparent distress in bed  [x]          Call bell left within reach  [x]          Nursing notified  []          Caregiver present  []          Bed alarm activated    COMMUNICATION/EDUCATION:   [x]          Fall prevention education was provided and the patient/caregiver indicated understanding.  [x]          Patient/family have participated as able in goal setting and plan of care.  [x]          Patient/family agree to work toward stated goals and plan of care.  []          Patient understands intent and goals of therapy, but is neutral about his/her participation.  []          Patient is unable to participate in goal setting and plan of care.    Thank you for this referral.  Tedi Horns, PT   Time Calculation: 40 mins       Eval Complexity: History: HIGH Complexity :3+ comorbidities / personal factors will impact the outcome/ POC Exam:MEDIUM Complexity : 3 Standardized tests and measures addressing body structure, function, activity limitation and / or participation in recreation  Presentation: LOW Complexity : Stable, uncomplicated  Clinical Decision Making:Low Complexity    Overall Complexity:LOW

## 2018-10-16 NOTE — Interval H&P Note (Signed)
Reviewed PTA medication list with patient/caregiver and patient/caregiver denies any additional medications.     Dual skin assessment completed by F Anderson RN and K SMith RN.

## 2018-10-16 NOTE — Op Note (Signed)
[facility]  Total Left Hip Arthroplasty    Patient: Teneka Baton MRN: [id number]  SSN: [id number]    Date of Birth: 1970-09-30  Age: 48 y.o.  Sex: female      Date of Surgery: 10/16/2018   Preoperative Diagnosis: UNILATERAL PRIMARY OSTEOARTHRITIS,LEFT HIP   Postoperative Diagnosis: UNILATERAL PRIMARY OSTEOARTHRITIS,LEFT HIP   Location: [facility]  Surgeon: Shaaron Adler, MD  Assistant:  Victorio Palm PA - C    Anesthesia: General     Procedure: Total lt Hip Arthroplasty    Findings:  Degenerative joint disease of the lt hip.     Estimated Blood Loss: 550 cc    Specimens: None    Complications: None    Implants:   Implant Name Type Inv. Item Serial No. Manufacturer Lot No. LRB No. Used Action   CUP ACET SECTOR GRIPTION -- TI - [id number]  CUP ACET SECTOR GRIPTION -- TI  [employer] [id number] Left 1 Implanted   LINER ACET PINN NEUT 36X56MM -- ALTRX - [id number]  LINER ACET PINN NEUT 36X56MM -- ALTRX  [employer] [id number] Left 1 Implanted   SCR ACET CANC PINN 6.5X25MM SS --  - [id number]  SCR ACET CANC PINN 6.5X25MM SS --   [employer] [id number] Left 1 Implanted   STEM FEM TAPR SZ4 STD OFFSET -- ACTIS - [id number]  STEM FEM TAPR SZ4 STD OFFSET -- ACTIS  [employer] [id number] Left 1 Implanted   HEAD FEM CER ARTC EZ 36 +12 -- BIOLOX DELTA - [id number]  HEAD FEM CER ARTC EZ 36 +12 -- BIOLOX DELTA  [employer] [id number] Left 1 Implanted       Procedure Detail:  After the patient was brought to the operating suite, She was effectively anesthetized using general anesthesia, then transferred to the Hana table and secured in a standard fashion. Her lt hip was then prepped with Chloroprep and draped in a normal sterile orthopedic fashion. She was given appropriate intravenous antibiotic preoperatively. After a proper timeout was performed, a direct anterior approach to the hip was performed using a short Smith-Petersen interval. The  incision was placed approximately 3 cm lateral to the anterior superior iliac spine and progressed distally and laterally for a length of approximately 4 inches. Incision was made through the Tensor Fascia Lata with the knife and continued with the Mayo scissors. An Allis clamp was placed on the medial border of the Tensor Fascia Lata and disection continued on the medial border of Tensor Fascia Lata Muscle. Dissection was continued down to the lateral hip capsule. A Cobra retractor was placed on the lateral hip capsule. A Hibbs Retractor was placed distally and a second Cobra retractor was placed on the medial hip capsule. The Lateral Femoral Circumflex Vessels were identified clamped and cauterized. Anterior capsulotomy was performed. Portions of the capsule were removed. The Cobra retractors were then placed on the femoral neck. The degenerative changes of the hip were noted. Femoral neck osteotomy was then performed. The head and neck were removed. The neck length was confirmed with the image intensification. The labrum was excised. The acetabulum was then reamed up to 56 mm with good bleeding bone in all quadrants obtained. The cup was then irrigated with pulse lavage system. A 56 mm Depuy Cluster hole cup was then impacted in place with excellent stable fixation obtained, placing the cup at about 45 degrees of abduction, 20 degrees of anteversion.  The hole eliminator  was placed. one screw was placed. The 36 mm inner diameter ceramic liner was then impacted in place. Attention was turned to the femur, which was delivered into the wound with a combination of extension, external rotation, and adduction, and using the hook on the Hana table to deliver the femur into the wound. The box osteotome was used followed by the lateralizing broach. The canal was broached up to a size 4. A trial reduction was then performed with the standard neck offset and 1.5 mm head trial. This was evaluated for leg length and canal  fill. The broach was removed. The canal was irrigated with the pulse lavage system. The size 4 stem was then impacted into position with good fixation being obtained.  The +12x36 ceramic head was impacted into position after drying the morse taper with a sponge. The final reduction was performed and once again leg lengths and offset were evaluated radiographically, using the C-arm. The leg was lengthened somewhat as desired based on pre op discussion with patient. Excellent functional stability was noted. Final irrigation was done at this time. Exparel was placed in the wound edges. Marcaine with epinepherine, morphine, toradol, and tranexamic acid were then placed in the wound.  The wound was closed in layers. The tensor fascia lata was closed with #1 Vicryl in a running type stitch. Subcutaneous tissue was closed with 2-0 Vicryl in a simple buried stitch, and the skin was closed with a subcuticular 3-0 monocryl followed by the Exofin system. Mepilex dressing was then applied. She tolerated this well, was transferred to the recovery room bed, and taken to recovery room in stable condition. All sponge and needle counts were correct.     Signed By: Shaaron Adler, MD     October 16, 2018

## 2018-10-16 NOTE — Interval H&P Note (Signed)
Myriam Jacobson to inform pt. And family that Sx may come to take into the OR in 20 min or so.

## 2018-10-16 NOTE — Op Note (Signed)
BRIEF OPERATIVE NOTE    Date of Procedure: 10/16/2018   Preoperative Diagnosis: UNILATERAL PRIMARY OSTEOARTHRITIS,LEFT HIP  Postoperative Diagnosis: UNILATERAL PRIMARY OSTEOARTHRITIS,LEFT HIP    Procedure(s):  LEFT HIP:  TOTAL HIP REPLACEMENT ANTERIOR APPROACH W/C-ARM, " SPEC POP"  Surgeon(s) and Role:     Shaaron Adler, MD - Primary         Surgical Assistant: Victorio Palm, PA-C    Surgical Staff:  Circ-1: Elbert Ewings, RN  Circ-Relief: Irena Reichmann, RN  Physician Assistant: Manson Allan, PA-C  Radiology Technician: Elberta Spaniel  Scrub Tech-1: Alric Seton, [place]  Scrub Tech-2: Elsie Stain B  Surg Asst-1: Jackquline [place]  Event Time In   Incision Start 1256   Incision Close 1451     Anesthesia: General   Estimated Blood Loss:  Specimens: * No specimens in log *   Findings: Severe DJD   Complications: None  Implants:   Implant Name Type Inv. Item Serial No. Manufacturer Lot No. LRB No. Used Action   CUP ACET SECTOR GRIPTION -- TI - [id number]  CUP ACET SECTOR GRIPTION -- TI  [employer] [id number] Left 1 Implanted   LINER ACET PINN NEUT 36X56MM -- ALTRX - [id number]  LINER ACET PINN NEUT 36X56MM -- ALTRX  [employer] [id number] Left 1 Implanted   SCR ACET CANC PINN 6.5X25MM SS --  - [id number]  SCR ACET CANC PINN 6.5X25MM SS --   [employer] [id number] Left 1 Implanted   STEM FEM TAPR SZ4 STD OFFSET -- ACTIS - [id number]  STEM FEM TAPR SZ4 STD OFFSET -- ACTIS  [employer] [id number] Left 1 Implanted   HEAD FEM CER ARTC EZ 36 +12 -- BIOLOX DELTA - [id number]  HEAD FEM CER ARTC EZ 36 +12 -- BIOLOX DELTA  [employer] [id number] Left 1 Implanted

## 2018-10-16 NOTE — Interval H&P Note (Signed)
 TRANSFER - OUT REPORT:    Verbal report given to Shavonna(name) on Sierra Schmitt  being transferred to 2b(unit) for routine post - op       Report consisted of patient's Situation, Background, Assessment and   Recommendations(SBAR).     Information from the following report(s) SBAR, Kardex, OR Summary, Procedure Summary, Intake/Output and MAR was reviewed with the receiving nurse.    Lines:   Peripheral IV 10/16/18 Right Cephalic (Active)   Site Assessment Clean, dry, & intact 10/16/2018  3:23 PM   Phlebitis Assessment 0 10/16/2018  3:23 PM   Infiltration Assessment 0 10/16/2018  3:23 PM   Dressing Status Clean, dry, & intact 10/16/2018  3:23 PM   Dressing Type Transparent;Tape 10/16/2018  3:23 PM   Hub Color/Line Status Infusing;Green 10/16/2018 10:06 AM   Alcohol Cap Used No 10/16/2018 10:06 AM        Opportunity for questions and clarification was provided.      Patient transported with:   Registered Nurse  Tech

## 2018-10-16 NOTE — Progress Notes (Signed)
Head to toe assessment completed at this time. Pt denies chest pain or SOB. Pt denies any numbness or tingling to extremities. Fall risk band in place. Pt educated on side effects of medication and the use of the incentive spirometer. Pt encouraged to manage pain and call for assistance. Pt left in bed with call light within reach, bed in low position and wheels locked. Will continue to monitor.    Pt left in the room no signs of distress. Pt educated on side effects of medication and use of incentive spirometer. Pt pain managed by PRN medication per MAR. All Pt's concerns addressed.

## 2018-10-16 NOTE — Anesthesia Pre-Procedure Evaluation (Signed)
Relevant Problems   No relevant active problems       Anesthetic History   No history of anesthetic complications            Review of Systems / Medical History  Patient summary reviewed, nursing notes reviewed and pertinent labs reviewed    Pulmonary          Smoker    Pertinent negatives: No COPD, asthma, recent URI and sleep apnea  Comments: Vaporized nicotine - did not abstain DOS   Neuro/Psych   Within defined limits           Cardiovascular  Within defined limits                Exercise tolerance: >4 METS     GI/Hepatic/Renal  Within defined limits              Endo/Other        Arthritis  Pertinent negatives: No diabetes, hypothyroidism, hyperthyroidism and obesity   Other Findings              Physical Exam    Airway  Mallampati: II  TM Distance: 4 - 6 cm  Neck ROM: normal range of motion   Mouth opening: Normal     Cardiovascular  Regular rate and rhythm,  S1 and S2 normal,  no murmur, click, rub, or gallop             Dental  No notable dental hx       Pulmonary  Breath sounds clear to auscultation               Abdominal  GI exam deferred       Other Findings            Anesthetic Plan    ASA: 2  Anesthesia type: general          Induction: Intravenous  Anesthetic plan and risks discussed with: Patient and Family      GA/LMA

## 2018-10-17 LAB — METABOLIC PANEL, BASIC
Anion gap: 3 mmol/L (ref 3.0–18)
BUN/Creatinine ratio: 25 — ABNORMAL HIGH (ref 12–20)
BUN: 11 MG/DL (ref 7.0–18)
CO2: 29 mmol/L (ref 21–32)
Calcium: 8.2 MG/DL — ABNORMAL LOW (ref 8.5–10.1)
Chloride: 110 mmol/L (ref 100–111)
Creatinine: 0.44 MG/DL — ABNORMAL LOW (ref 0.6–1.3)
GFR est AA: >60 mL/min/{1.73_m2} (ref >60)
GFR est non-AA: >60 mL/min/{1.73_m2} (ref >60)
Glucose: 100 mg/dL — ABNORMAL HIGH (ref 74–99)
Potassium: 3.8 mmol/L (ref 3.5–5.5)
Sodium: 142 mmol/L (ref 136–145)

## 2018-10-17 LAB — HGB & HCT
HCT: 22.8 % — ABNORMAL LOW (ref 35.0–45.0)
HGB: 7 g/dL — ABNORMAL LOW (ref 12.0–16.0)

## 2018-10-17 LAB — BASIC METABOLIC PANEL
Anion Gap: 3 mmol/L (ref 3.0–18)
BUN: 11 MG/DL (ref 7.0–18)
Bun/Cre Ratio: 25 — ABNORMAL HIGH (ref 12–20)
CO2: 29 mmol/L (ref 21–32)
Calcium: 8.2 MG/DL — ABNORMAL LOW (ref 8.5–10.1)
Chloride: 110 mmol/L (ref 100–111)
Creatinine: 0.44 MG/DL — ABNORMAL LOW (ref 0.6–1.3)
EGFR IF NonAfrican American: >60 mL/min/{1.73_m2} (ref >60)
GFR African American: >60 mL/min/{1.73_m2} (ref >60)
Glucose: 100 mg/dL — ABNORMAL HIGH (ref 74–99)
Potassium: 3.8 mmol/L (ref 3.5–5.5)
Sodium: 142 mmol/L (ref 136–145)

## 2018-10-17 LAB — HEMOGLOBIN AND HEMATOCRIT
Hematocrit: 22.8 % — ABNORMAL LOW (ref 35.0–45.0)
Hemoglobin: 7 g/dL — ABNORMAL LOW (ref 12.0–16.0)

## 2018-10-17 MED ORDER — OXYCODONE-ACETAMINOPHEN 5 MG-325 MG TAB
5-325 mg | ORAL_TABLET | ORAL | 0 refills | Status: AC | PRN
Start: 2018-10-17 — End: 2018-10-22

## 2018-10-17 MED ORDER — CRUTCH
0 refills | Status: AC
Start: 2018-10-17 — End: ?

## 2018-10-17 MED ORDER — SODIUM CHLORIDE 0.9 % IV
INTRAVENOUS | Status: DC | PRN
Start: 2018-10-17 — End: 2018-10-17

## 2018-10-17 MED ORDER — ASPIRIN 81 MG TAB, DELAYED RELEASE
81 mg | ORAL_TABLET | Freq: Two times a day (BID) | ORAL | 0 refills | Status: AC
Start: 2018-10-17 — End: ?

## 2018-10-17 MED FILL — OXYCODONE-ACETAMINOPHEN 5 MG-325 MG TAB: 5-325 mg | ORAL | Qty: 2

## 2018-10-17 MED FILL — DIPHENHYDRAMINE 25 MG CAP: 25 mg | ORAL | Qty: 1

## 2018-10-17 MED FILL — CEFAZOLIN 2 GM/50 ML IN DEXTROSE (ISO-OSMOTIC) IVPB: 2 gram/50 mL | INTRAVENOUS | Qty: 1

## 2018-10-17 MED FILL — SODIUM CHLORIDE 0.9 % IV: INTRAVENOUS | Qty: 250

## 2018-10-17 MED FILL — ASPIRIN 81 MG TAB, DELAYED RELEASE: 81 mg | ORAL | Qty: 1

## 2018-10-17 MED FILL — OXYCODONE-ACETAMINOPHEN 5 MG-325 MG TAB: 5-325 mg | ORAL | Qty: 1

## 2018-10-17 NOTE — Progress Notes (Signed)
Progress Note     Patient: Sierra Schmitt MRN: [id number]  SSN: [id number]    Date of Birth: Apr 21, 1971  Age: 48 y.o.  Sex: female      POD:    1 Day Post-Op  S/P:    Procedure(s):  LEFT HIP:  TOTAL HIP REPLACEMENT ANTERIOR APPROACH W/C-ARM, " SPEC POP"     Subjective:   Pt is with complaints of pain.  She has been taking her pain medication which does help with her discomfort. She admits to feeling fatigued and lightheaded this morning.     Objective:     Patient Vitals for the past 12 hrs:   BP Temp Pulse Resp SpO2   10/17/18 0803 91/51 98.8 ??F (37.1 ??C) 79 16 100 %   10/17/18 0348 95/47 98.4 ??F (36.9 ??C) 74 16 100 %   10/16/18 2317 94/46 ??? ??? ??? ???   10/16/18 2250 ??? 98.5 ??F (36.9 ??C) 78 16 100 %     Recent Labs     10/17/18  0250   HGB 7.0*   HCT 22.8*   NA 142   K 3.8   CL 110   CO2 29   BUN 11   CREA 0.44*   GLU 100*       Pt. resting in bed. Lower extremity operative dressing clean/dry/intact. Neurovascularly intact     Assessment:     Awake and alert. In good spirits.          Plan:   1. Continue pain management/ ice to operative area.  2. Continue to progress with PT/OT.  3. Discharge planning to home with home health today as long as her pain remains well controlled and she passes PT.  4. Patient's Hgb was 7.0 this morning and she reports feeling fatigued and lightheaded. Will order one unit PRBCs

## 2018-10-17 NOTE — Progress Notes (Signed)
Oral and Written notification given to patient and/or caregiver informing them that they are currently an Outpatient receiving care in our facility.  Outpatient services include Observation Services under [facility] and [facility] requirements.

## 2018-10-17 NOTE — Progress Notes (Signed)
Problem: Self Care Deficits Care Plan (Adult)  Goal: *Acute Goals and Plan of Care (Insert Text)  Description: Initial Occupational Therapy Goals (10/17/2018) Within 7 day(s):    1. Patient will perform grooming standing sinkside with SBA for increased independence in ADLs.  2. Patient will perform UB dressing with supervision seated EOB for increased independence with ADLs.  3. Patient will perform LB dressing with CGA & A/E PRN for increased independence with ADLs.  4. Patient will perform all aspects of toileting with SBA for increased independence with ADLs.  5. Patient will perform LB ADLs utilizing body mechanics & adaptive strategies with 1 verbal cue for increased safety in ADLs.  6. Patient will independently apply energy conservation techniques with 1 verbal cue(s)for increased independence with ADLs.     Outcome: Resolved/Met   OCCUPATIONAL THERAPY EVALUATION/DISCHARGE    Patient: Sierra Schmitt (48 y.o. female)  Date: 10/17/2018  Primary Diagnosis: Osteoarthritis of left hip [M16.12]  Procedure(s) (LRB):  LEFT HIP:  TOTAL HIP REPLACEMENT ANTERIOR APPROACH W/C-ARM, " SPEC POP" (Left) 1 Day Post-Op   Precautions: Fall, WBAT  PLOF: pt mod I for ADLs, utilizing A/E for lower body dressing since previous R THR (about 3 weeks ago)    ASSESSMENT AND RECOMMENDATIONS:  Based on the objective data described below, the patient presents with LLE decreased ROM and strength affecting LE ADLs. Patient able to utilize sock aid for B sock donning, and required physical assist without. Pt had previously been using sock aid for dressing since she had her R THR. Pt complete bathroom mobility/toilet transfer with SBA. Recommended toilet safety frame to improve safety for toileting, pt reports she is planning to order one. Provided/educated on use of sock aid, reacher, long handled sponge to improve performance of ADLs due to recent R THR surgery and current L THR recovery. Pt able to don pants without assist using reacher.      Education: Reviewed home safety, body mechanics, importance of moving every hour to prevent joint stiffness, role of ice for edema/pain control, Rolling Walker management/safety, and adaptive dressing techniques with patient verbalizing  understanding at this time     Skilled Occupational Therapy is not indicated at this time in this setting. Patient should continue to improve as pain and ROM/strength improves.  Discharge Recommendations: Home Health  Further Equipment Recommendations for Discharge: toilet safety frame      SUBJECTIVE:   Patient stated ???I had both of my hips done.???    OBJECTIVE DATA SUMMARY:     Past Medical History:   Diagnosis Date    Arthritis     osteoarthirtis    Chronic pain     back and joints    Nicotine vapor product user      Past Surgical History:   Procedure Laterality Date    HX CHOLECYSTECTOMY  2002    HX GI  2005    Hemorrhoidectomy    HX HYSTERECTOMY  2018    HX LUMBAR LAMINECTOMY  2007    HX TUBAL LIGATION  1997    TOTAL HIP ARTHROPLASTY Right 09/2018     Barriers to Learning/Limitations: yes;  physical  Compensate with: visual, verbal, tactile, kinesthetic cues/model    Home Situation/Prior level of Function: pt mod I for ADLs, tub/shower with TTB, utilizing adaptive dressing aides  Home Situation  Home Environment: Apartment  # Steps to Enter: 12  Rails to Enter: Yes  Hand Rails : Right  One/Two Story Residence: One story  Living Alone: Yes  Support Systems: Friends _0  neighbors  Patient Expects to be Discharged to:: Apartment  Current DME Used/Available at Home: Cane, straight, Environmental consultant, Tub transfer bench  Tub or Shower Type: Tub/Shower combination  []  Right hand dominant   []  Left hand dominant    Cognitive/Behavioral Status:  Neurologic State: Alert  Orientation Level: Oriented X4  Cognition: Appropriate decision making;Appropriate for age attention/concentration;Appropriate safety awareness;Follows commands   Safety/Judgement: Awareness of environment;Home safety;Good awareness of safety precautions    Skin: L hip incision w/ Mepilex   Edema: compression hose in place & applied ice     Coordination:  Coordination: Within functional limits  Fine Motor Skills-Upper: Left Intact;Right Intact    Gross Motor Skills-Upper: Left Intact;Right Intact    Balance:  Sitting: Intact  Standing: Intact;With support    Strength: BUE  Strength: Generally decreased, functional    Tone & Sensation:BUE  Tone: Normal  Sensation: Intact    Range of Motion:BUE  AROM: Generally decreased, functional    Functional Mobility and Transfers for ADLs:  Bed Mobility:  Supine to Sit: Contact guard assistance  Sit to Supine: Contact guard assistance    Transfers:  Sit to Stand: Stand-by assistance   Toilet Transfer : Stand-by assistance    ADL Assessment/Intervention:  Feeding: Setup  Oral Facial Hygiene/Grooming: Setup  Bathing: Contact guard assistance  Upper Body Dressing: Setup  Lower Body Dressing: Contact guard assistance  Toileting: Stand by assistance     Upper Body Dressing Assistance  Dressing Assistance: Set-up  Pullover Shirt: Set-up  Lower Body Dressing Assistance  Dressing Assistance: Contact guard assistance  Pants With Elastic Waist: Contact guard assistance  Socks: Stand-by assistance  Position Performed: Seated edge of bed  Cues: Verbal cues provided  Adaptive Equipment Used: Reacher;Sock aid    LE Adaptive Equipment:  [x] sock aid  [x] reacher   [x] long handle sponge  [] long handle shoe horn  [] leg lifter   was issued in order to maximize patient's independence for independent living/decrease caregiver burden/assist with co-morbidities affecting function and body mechanics.    Toileting  Toileting Assistance: Stand-by assistance  Bladder Hygiene: Supervision  Clothing Management: Stand-by assistance    Cognitive Retraining  Orientation Retraining: Awareness of environment   Problem Solving: Awareness of environment;General alternative solution;Deductive reason  Executive Functions: Executing cognitive plans  Organizing/Sequencing: Breaking task down;Prioritizing;Two step sequence  Attention to Task: Single task  Maintains Attention For (Time): Greater than 10 minutes  Following Commands: Follows one step commands/directions  Safety/Judgement: Awareness of environment;Home safety;Good awareness of safety precautions    Pain:  Pain level pre-treatment: 6/10  Pain level post-treatment: 6/10  Pain Intervention(s): Medication administer by Nursing (see MAR); Rest, Ice, Repositioning   Response to intervention: Nurse notified, see doc flow sheet    Activity Tolerance:   Fair. Patient able to stand ~4 minute(s). Patient able to complete ADLs with intermittent rest breaks. Patient limited by pain, strength, ROM. Patient unsteady.     Please refer to the flowsheet for vital signs taken during this treatment.  After treatment:   []  Patient left in no apparent distress sitting up in chair  []  Patient sitting on EOB  [x]  Patient left in no apparent distress in bed  [x]  Call bell left within reach  [x]  Nursing notified  []  Caregiver present  [x]  Ice applied  [x]  SCD's on while back in bed    COMMUNICATION/EDUCATION:   Communication/Collaboration:  [x]  Role of Occupational Therapy in the acute care setting.  [x]      Home safety education was provided and the patient/caregiver indicated understanding.  [x]      Patient/family have participated as able in goal setting and plan of care.  [x]      Patient/family agree to work toward stated goals and plan of care.  []      Patient understands intent and goals of therapy, but is neutral about his/her participation.  []      Patient is unable to participate in plan of care at this time.    Thank you for this referral.  Lanelle Bal, OTR/L  Time Calculation: 29 mins    Eval Complexity: History: MEDIUM Complexity : Expanded review of history  including physical, cognitive and psychosocial  history ;   Examination: LOW Complexity : 1-3 performance deficits relating to physical, cognitive , or psychosocial skils that result in activity limitations and / or participation restrictions ;   Decision Making:LOW Complexity : No comorbidities that affect functional and no verbal or physical assistance needed to complete eval tasks

## 2018-10-17 NOTE — Progress Notes (Signed)
Transition of Care (TOC) Plan:     Chart reviewed, spoke with pt on phone. FOC offered, pt chose [facility] [phone] for follow up; referral placed with CMS. Pt asking for specialized cane for home, states home PT recommended them after last surgery. CM advised that RW recommended for home by PT here and that home PT can speak with MD for order. Pt verbalizes understanding. Pt has RW for home and states she has assistance at home.       TOC Transportation:   How is patient being transported at discharge? Family/Friend      When? Once cleared by Therapy between 12-2pm     Is transport scheduled? N/A      Follow-up appointment and transportation:   PCP/Specialist?  See AVS for Appointment         Who is transporting to the follow-up appointment? Family/Friend      Is transport for follow up appointment scheduled? N/A    Communication plan (with patient/family):    Who is being called?  Patient or Next of Kin?  Responsible party?     Patient      What number(s) is to be used?  See Facesheet      What service provider is calling for [facility] services?              When are they calling?  24-48 hours following discharge    Readmission Risk?  (Green/Low; Yellow/Moderate; Red/High):  Green    Care Management Interventions  PCP Verified by CM: Yes  Transition of Care Consult (CM Consult): Home Health  [facility]: No  Reason Outside Museum/gallery curator Agency Chosen: Out of service area([facility])  Discharge Durable Medical Equipment: No  Physical Therapy Consult: Yes  Occupational Therapy Consult: Yes  Current Support Network: Own Home, Family Lives Nearby  Confirm Follow Up Transport: Family  The Plan for Transition of Care is Related to the Following Treatment Goals : HH  The Patient and/or Patient Representative was Provided with a Choice of Provider and Agrees with the Discharge Plan?: Yes  Freedom of Choice List was Provided with Basic Dialogue that Supports the  Patient's Individualized Plan of Care/Goals, Treatment Preferences and Shares the Quality Data Associated with the Providers?: Yes  Discharge Location  Discharge Placement: Home with home health

## 2018-10-17 NOTE — Progress Notes (Signed)
Problem: Mobility Impaired (Adult and Pediatric)  Goal: *Acute Goals and Plan of Care (Insert Text)  Description: In 1-7 days pt will be able to perform:  STG:  1.  Bed mobility:  Rolling L to R to L modified independent for positioning.  2.  Supine to sit to supine S with HR for meals.  3.  Sit to stand to sit S with RW in prep for ambulation.  LTG:  1.  Gait:  Ambulate >155f S with RW, WBAT, for home/community mobility.  2.  Stair Negotiation:  Ascend/descend >10 steps CGA with HR for home entry.  3.  Activity tolerance:  Tolerate up in chair 1-2 hours for ADL???s.  4.  Patient/Family Education:  Patient/family to be independent with HEP for follow-up care and safe discharge.   10/17/2018 1453 by CAlvera Singh PTA  Outcome: Resolved/Met  10/17/2018 1356 by CAlvera Singh PTA  Outcome: Progressing Towards Goal   PHYSICAL THERAPY TREATMENT/DISCHARGE    Patient: Sierra Schmitt((48y.o. female)  Date: 10/17/2018  Diagnosis: Osteoarthritis of left hip [M16.12]   <principal problem not specified>  Procedure(s) (LRB):  LEFT HIP:  TOTAL HIP REPLACEMENT ANTERIOR APPROACH W/C-ARM, " SPEC POP" (Left) 1 Day Post-Op  Precautions: Fall, WBAT  Chart, physical therapy assessment, plan of care and goals were reviewed.    ASSESSMENT:  Pt meets needs for safe home mobility, expresses understanding of HEP and is cleared from this level of PT.     Progression toward goals:  [x]       Goals met  []       Improving appropriately and progressing toward goals  []       Improving slowly and progressing toward goals  []       Not making progress toward goals and plan of care will be adjusted     PLAN:  Patient will be discharged from physical therapy at this time.  Rationale for discharge:  [x]  Goals Achieved  []  Plateau Reached  []  Patient not participating in therapy  []  Other:  Discharge Recommendations:  Home Health  Further Equipment Recommendations for Discharge:  rolling walker     SUBJECTIVE:    Patient stated ??? I can go to my mom's house if  I need to but she [place] in [place] ???    OBJECTIVE DATA SUMMARY:   Critical Behavior:  Neurologic State: Alert  Orientation Level: Oriented X4  Cognition: Appropriate decision making, Appropriate for age attention/concentration, Appropriate safety awareness, Follows commands  Safety/Judgement: Awareness of environment, Home safety, Good awareness of safety precautions  Functional Mobility Training:  Bed Mobility:  Rolling: Modified independent  Supine to Sit: Supervision  Sit to Supine: Supervision  Scooting: Supervision    Transfers:  Sit to Stand: Supervision  Stand to Sit: Supervision    Balance:  Sitting: Intact  Standing: Intact;With support  Ambulation/Gait Training:  Distance (ft): 150 Feet (ft)  Assistive Device: Walker, rolling;Gait belt  Ambulation - Level of Assistance: Supervision  Gait Abnormalities: Antalgic;Decreased step clearance;Step to gait  Left Side Weight Bearing: As tolerated  Stance: Left decreased  Speed/Cadence: Slow  Step Length: Left shortened;Right shortened  Swing Pattern: Right asymmetrical;Left asymmetrical  Interventions: Verbal cues;Safety awareness training(R HHA)    Stairs:  Number of Stairs Trained: 20  Stairs - Level of Assistance: Contact guard assistance   Rail Use: Left     Pain:  Pain Scale 1: Numeric (0 - 10)  Pain Intensity 1: 7  Pain Location 1: Hip  Pain  Orientation 1: Left  Pain Description 1: Aching  Pain Intervention(s) 1: Medication (see MAR)  Activity Tolerance:   Fair+    After treatment:   [x]  Patient left in no apparent distress sitting up in chair  []  Patient left in no apparent distress in bed  [x]  Call bell left within reach  [x]  Nursing notified  []  Caregiver present  []  Bed alarm activated  Alvera Singh, PTA   Time Calculation: 25 mins

## 2018-10-17 NOTE — Progress Notes (Signed)
Progress Note     Patient: Sierra Schmitt MRN: [id number]  SSN: [id number]    Date of Birth: 10/26/1970  Age: 47 y.o.  Sex: female      POD:    1 Day Post-Op  S/P:    Procedure(s):  LEFT HIP:  TOTAL HIP REPLACEMENT ANTERIOR APPROACH W/C-ARM, " SPEC POP"     Subjective:   Pt is without complaints of pain.       Objective:     Patient Vitals for the past 12 hrs:   BP Temp Pulse Resp SpO2   10/17/18 0348 95/47 98.4 ??F (36.9 ??C) 74 16 100 %   10/16/18 2317 94/46 ??? ??? ??? ???   10/16/18 2250 ??? 98.5 ??F (36.9 ??C) 78 16 100 %   10/16/18 1904 102/54 98.7 ??F (37.1 ??C) 84 16 100 %     Recent Labs     10/17/18  0250   HGB 7.0*   HCT 22.8*   NA 142   K 3.8   CL 110   CO2 29   BUN 11   CREA 0.44*   GLU 100*       Pt resting in bed. Lower extremity operative dressing clean/dry/intact. Neurovascularly intact.     Assessment:     Awake and alert. In good spirits. X rays satisfactory. Probable discharge today. Some dizziness on standing and hgb may further decrease so will xfuse one unit before dc.           Plan:   1. Continue pain management/ ice to operative area.  2. Continue to progress with PT/OT.  3. Discharge planning to home with home health

## 2018-10-17 NOTE — Other (Signed)
Bedside and Verbal shift change report given to T. Moody (oncoming nurse) by N.Stewart (offgoing nurse). Report included the following information SBAR, Kardex, Intake/Output, MAR and Recent Results.

## 2018-10-17 NOTE — Progress Notes (Signed)
1453-Discharge instructions reviewed with patient at this time. Opportunity for questions and clarification was provided.??Patient has verbalized understanding. Patient was provided with care notes to include side effects of RX's. Arm bands removed and shredded. AVS reviewed with Tahira Moody RN.      IV removed. Dressing CDI.

## 2018-10-17 NOTE — Discharge Summary (Signed)
Total Hip Discharge Summary    Patient: Sierra Schmitt               Sex: female         MRN: [id number]       Date of Birth:  1971/07/22      Age:  48 y.o.        LOS:  LOS: 0 days                DOA: 10/16/2018    Discharge Date: October 20, 2018    Admission Diagnoses: Osteoarthritis of left hip [M16.12]    Discharge Diagnoses:    Problem List as of 10-20-2018 Date Reviewed: 2018/10/20          Codes Class Noted - Resolved    Osteoarthritis of left hip ICD-10-CM: M16.12  ICD-9-CM: 715.95  10/16/2018 - Present        Osteoarthritis of right hip ICD-10-CM: M16.11  ICD-9-CM: 715.95  09/25/2018 - Present              This is a 48 y.o. female with a  history of ongoing hip pain secondary to degenerative joint disease. The patient has failed to respond to conservative care. The option of a total hip arthroplasty, anterior approach was discussed with the patient. Risks and benefits of the procedure were explained to the patient as well as other treatment options and possible surgical outcomes. The patient acknowledged and consent was obtained. The patient was therefore scheduled to undergo a left total hip arthroplasty with Dr. Shaaron Adler. The patient was taken to the operating room for the above-stated procedure. IV antibiotics were given prior to the incision. SCDs were used for DVT prophylaxis. The patient had an estimated intraoperative blood loss of 550 mL. The patient tolerated the procedure well without any complications, and was taken to the recovery room in stable condition. The patient was then transferred to the postoperative orthopedic floor for convalescence, PT, pain management, as well as discharge planning. Physical therapy and occupational therapy initiated their evaluation and treatment and continued to follow the patient until the patient was discharged. Post operative pain was adequately managed with use of oral narcotics prior to discharge. DVT prophylaxis was initiated on the day of surgery including; aspirin,  compression stockings and bilateral foot pumps. At the time of discharge, the patient was able to ambulate safely, go up and down stairs and had an understanding of the explicit discharge precautions and instructions following surgery.  Home Health will come out to assist the patient with this. The patient was discharged to follow up with Dr. Shaaron Adler in approximately 10 to 14 days.     Discharge Condition: Good  DISPOSITION: To home. On the day of discharge the patient was afebrile. Vital signs were stable. The patient was in no acute distress. her Left hip incision was clean, dry, and intact. Extremity was warm and well-perfused, distally neurovascularly intact.    DISCHARGE INSTRUCTIONS:  The patient will be discharged home on Aspirin 81 mg PO BID x 1 month for DVT prophylaxis.  Continue physical therapy for gait training and strengthening. Continue therapeutic exercises. Follow up in 10 to 14 days with Dr. Shaaron Adler.      DISCHARGE MEDICATIONS:   Current Discharge Medication List      START taking these medications    Details   Crutch misc [place] crutches for ambulation s/p bilateral THR within the past month  Qty: 2 Each, Refills: 0  CONTINUE these medications which have CHANGED    Details   aspirin delayed-release 81 mg tablet Take 1 Tab by mouth two (2) times a day.  Qty: 60 Tab, Refills: 0      oxyCODONE-acetaminophen (PERCOCET) 5-325 mg per tablet Take 1-2 Tabs by mouth every four (4) hours as needed for Pain for up to 5 days. Max Daily Amount: 12 Tabs.  Qty: 50 Tab, Refills: 0    Associated Diagnoses: S/P hip replacement, left         CONTINUE these medications which have NOT CHANGED    Details   ferrous sulfate (Iron) 325 mg (65 mg iron) tablet Take 325 mg by mouth three (3) times daily (with meals).

## 2018-10-17 NOTE — Progress Notes (Signed)
Problem: Mobility Impaired (Adult and Pediatric)  Goal: *Acute Goals and Plan of Care (Insert Text)  Description: In 1-7 days pt will be able to perform:  STG:  1.  Bed mobility:  Rolling L to R to L modified independent for positioning.  2.  Supine to sit to supine S with HR for meals.  3.  Sit to stand to sit S with RW in prep for ambulation.  LTG:  1.  Gait:  Ambulate >144ft S with RW, WBAT, for home/community mobility.  2.  Stair Negotiation:  Ascend/descend >10 steps CGA with HR for home entry.  3.  Activity tolerance:  Tolerate up in chair 1-2 hours for ADL???s.  4.  Patient/Family Education:  Patient/family to be independent with HEP for follow-up care and safe discharge.   Outcome: Progressing Towards Goal   PHYSICAL THERAPY TREATMENT    Patient: Sierra Schmitt (48 y.o. female)  Date: 10/17/2018  Diagnosis: Osteoarthritis of left hip [M16.12]   <principal problem not specified>  Procedure(s) (LRB):  LEFT HIP:  TOTAL HIP REPLACEMENT ANTERIOR APPROACH W/C-ARM, " SPEC POP" (Left) 1 Day Post-Op  Precautions: Fall, WBAT   Chart, physical therapy assessment, plan of care and goals were reviewed.    ASSESSMENT:  Pt in bed upon arrival. Reviewed HEP per protocol. Pt expressed understanding. Deferred out of bed activities as pt c/o dizziness and about to start transfusion. Cont POC. Pt also had multiple questions about stair training and proper AD.      Progression toward goals:  []       Improving appropriately and progressing toward goals  [x]       Improving slowly and progressing toward goals  []       Not making progress toward goals and plan of care will be adjusted     PLAN:  Patient continues to benefit from skilled intervention to address the above impairments.  Continue treatment per established plan of care.  Discharge Recommendations:  Home Health  Further Equipment Recommendations for Discharge:  rolling walker     SUBJECTIVE:   Patient stated ??? I am dizzy. They are about to start a blood transfusion  ???     OBJECTIVE DATA SUMMARY:   Critical Behavior:  Neurologic State: Alert  Orientation Level: Oriented X4  Cognition: Appropriate decision making, Appropriate for age attention/concentration, Appropriate safety awareness, Follows commands  Safety/Judgement: Awareness of environment, Home safety, Good awareness of safety precautions  Functional Mobility Training:    Therapeutic Exercises:       EXERCISE   Sets   Reps   Active Active Assist   Passive Self ROM   Comments   Ankle Pumps 1 10  [x]  []  []  []     Quad Sets/Glut Sets 1 10 [x]  []  []  []     Hamstring Sets   []  []  []  []     Short Arc Quads   []  []  []  []     Heel Slides 2 5 [x]  []  []  []     Straight Leg Raises   []  []  []  []     Hip Abd/Add   []  []  []  []     Long Arc Quads   []  []  []  []     Seated Marching   []  []  []  []     Standing Marching   []  []  []  []        []  []  []  []          Pain:  Pain Scale 1: Numeric (0 - 10)  Pain Intensity 1: 7  Pain Location  1: Hip  Pain Orientation 1: Left  Pain Description 1: Aching  Pain Intervention(s) 1: Medication (see MAR)  Activity Tolerance:   Fair     After treatment:   []  Patient left in no apparent distress sitting up in chair  [x]  Patient left in no apparent distress in bed  [x]  Call bell left within reach  []  Nursing notified  []  Caregiver present  []  Bed alarm activated      Westly Pam, PTA   Time Calculation: 23 mins

## 2018-10-17 NOTE — Progress Notes (Addendum)
0720- Assumed care of patient at this time. Report received from Nicky S, RN. Patient in chair. Pain 6/10. Call bell left within reach.     0742  - Patient in chair at this time. A/O x 4. Lungs clear, radial pulses present , pedal pulses present , abdomen soft and non-distended. Bowel sounds active, 18 G IV to right hand  Intact, patent and infusing. No signs of phlebitis or infiltration noted. TEDs and SCDs applied bilaterally. Skin warm and dry  with  mepilex dressing to left hip CDI. Patient denies numbness/tingling. Pain 6/10. Bed placed in lowest position, call bell within reach.     0850- Pain 7/10.  PRN Percocet 5 mg PO pain medication administered at this time. Patient has been educated on side effects. Side effect education sheets have been provided.     1020- Infusion started at 75 ml/hr, patient in bed, denies CP/SOB and discomfort associated with infusion    1050- Infusion rate increased to 125, patient tolerated well. Denies discomfort    1300- Pain 7/10.  PRN Percocet 5 mg PO pain medication administered at this time. Patient has been educated on side effects. Side effect education sheets have been provided.

## 2018-10-17 NOTE — Progress Notes (Signed)
 1453-Discharge instructions reviewed with patient at this time. Opportunity for questions and clarification was provided.Patient has verbalized understanding. Patient was provided with care notes to include side effects of RX's. Arm bands removed and shredded. AVS reviewed with Lino Limes RN.      IV removed. Dressing CDI.

## 2018-10-17 NOTE — Progress Notes (Signed)
58- Assumed care of patient at this time. Report received from Renold Genta, Charity fundraiser. Patient in chair. Pain 6/10. Call bell left within reach.     1610  - Patient in chair at this time. A/O x 4. Lungs clear, radial pulses present , pedal pulses present , abdomen soft and non-distended. Bowel sounds active, 18 G IV to right hand  Intact, patent and infusing. No signs of phlebitis or infiltration noted. TEDs and SCDs applied bilaterally. Skin warm and dry  with  mepilex dressing to left hip CDI. Patient denies numbness/tingling. Pain 6/10. Bed placed in lowest position, call bell within reach.     9604- Pain 7/10.  PRN Percocet 5 mg PO pain medication administered at this time. Patient has been educated on side effects. Side effect education sheets have been provided.     1020- Infusion started at 75 ml/hr, patient in bed, denies CP/SOB and discomfort associated with infusion    1050- Infusion rate increased to 125, patient tolerated well. Denies discomfort    1300- Pain 7/10.  PRN Percocet 5 mg PO pain medication administered at this time. Patient has been educated on side effects. Side effect education sheets have been provided.

## 2018-10-17 NOTE — Progress Notes (Signed)
Progress Note     Patient: Sierra Schmitt MRN: [id number]  SSN: [id number]    Date of Birth: 1971-04-13  Age: 48 y.o.  Sex: female      POD:    1 Day Post-Op  S/P:    Procedure(s):  LEFT HIP:  TOTAL HIP REPLACEMENT ANTERIOR APPROACH W/C-ARM, " SPEC POP"     Subjective:   Pt is without complaints of pain.       Objective:     Patient Vitals for the past 12 hrs:   BP Temp Pulse Resp SpO2   10/17/18 0348 95/47 98.4 ??F (36.9 ??C) 74 16 100 %   10/16/18 2317 94/46 ??? ??? ??? ???   10/16/18 2250 ??? 98.5 ??F (36.9 ??C) 78 16 100 %   10/16/18 1904 102/54 98.7 ??F (37.1 ??C) 84 16 100 %     Recent Labs     10/17/18  0250   HGB 7.0*   HCT 22.8*   NA 142   K 3.8   CL 110   CO2 29   BUN 11   CREA 0.44*   GLU 100*       Pt resting in bed. Lower extremity operative dressing clean/dry/intact. Neurovascularly intact.     Assessment:     Awake and alert. In good spirits. X rays satisfactory. Probable discharge today. Some dizziness on standing and hgb may further decrease so will xfuse one unit before dc.           Plan:   1. Continue pain management/ ice to operative area.  2. Continue to progress with PT/OT.  3. Discharge planning to home with home health

## 2018-10-17 NOTE — Progress Notes (Signed)
Transition of Care (TOC) Plan:     Chart reviewed, spoke with pt on phone. FOC offered, pt chose [phone] for follow up; referral placed with CMS. Pt asking for specialized cane for home, states home PT recommended them after last surgery. CM advised that RW recommended for home by PT here and that home PT can speak with MD for order. Pt verbalizes understanding. Pt has RW for home and states she has assistance at home.       TOC Transportation:   How is patient being transported at discharge? Family/Friend      When? Once cleared by Therapy between 12-2pm     Is transport scheduled? N/A      Follow-up appointment and transportation:   PCP/Specialist?  See AVS for Appointment         Who is transporting to the follow-up appointment? Family/Friend      Is transport for follow up appointment scheduled? N/A    Communication plan (with patient/family):    Who is being called?  Patient or Next of Kin?  Responsible party?     Patient      What number(s) is to be used?  See Facesheet      What service provider is calling for [facility] services?              When are they calling?  24-48 hours following discharge    Readmission Risk?  (Green/Low; Yellow/Moderate; Red/High):  Green    Care Management Interventions  PCP Verified by CM: Yes  Transition of Care Consult (CM Consult): Home Health  [facility]: No  Reason Outside Museum/gallery curator Agency Chosen: Out of service area([facility])  Discharge Durable Medical Equipment: No  Physical Therapy Consult: Yes  Occupational Therapy Consult: Yes  Current Support Network: Own Home, Family Lives Nearby  Confirm Follow Up Transport: Family  The Plan for Transition of Care is Related to the Following Treatment Goals : HH  The Patient and/or Patient Representative was Provided with a Choice of Provider and Agrees with the Discharge Plan?: Yes  Freedom of Choice List was Provided with Basic Dialogue that Supports the Patient's Individualized Plan of Care/Goals, Treatment  Preferences and Shares the Quality Data Associated with the Providers?: Yes  Discharge Location  Discharge Placement: Home with home health

## 2018-10-17 NOTE — Progress Notes (Signed)
Progress Notes by Manson Allan, PA-C at 10/17/18 4034                Author: Manson Allan, PA-C  Service: Orthopedic Surgery  Author Type: Physician Assistant       Filed: 10/17/18 0830  Date of Service: 10/17/18 0828  Status: Signed           Editor: Jerelyn Scott (Physician Assistant)  Cosigner: Shaaron Adler, MD at 11/09/18 0724                                    Progress Note           Patient: Sierra Schmitt  MRN: [id number]   SSN: [id number]          Date of Birth: 19-Jun-1971   Age: 48 y.o.   Sex: female         POD:    1 Day Post-Op   S/P:    Procedure(s):   LEFT HIP:  TOTAL HIP REPLACEMENT ANTERIOR APPROACH W/C-ARM, " SPEC POP"         Subjective:     Pt is with complaints  of pain.  She has been taking her pain medication which does help with her discomfort. She admits to feeling fatigued and lightheaded  this morning.         Objective:        Patient Vitals for the past 12 hrs:            BP  Temp  Pulse  Resp  SpO2            10/17/18 0803  91/51  98.8 ??F (37.1 ??C)  79  16  100 %            10/17/18 0348  95/47  98.4 ??F (36.9 ??C)  74  16  100 %     10/16/18 2317  94/46  --  --  --  --            10/16/18 2250  --  98.5 ??F (36.9 ??C)  78  16  100 %          Recent Labs           10/17/18   0250     HGB  7.0*     HCT  22.8*     NA  142     K  3.8     CL  110     CO2  29     BUN  11     CREA  0.44*        GLU  100*           Pt. resting in bed. Lower extremity operative dressing  clean/dry/intact. Neurovascularly intact         Assessment:        Awake and alert. In good spirits.                Plan:     1. Continue pain management / ice to operative area.   2. Continue to progress with PT/OT.   3. Discharge planning to home with home health today as long as her pain remains well controlled and she passes PT.   4. Patient's Hgb was 7.0 this morning and she reports feeling fatigued and lightheaded. Will order one unit PRBCs

## 2018-10-17 NOTE — Progress Notes (Signed)
Problem: Mobility Impaired (Adult and Pediatric)  Goal: *Acute Goals and Plan of Care (Insert Text)  Description: In 1-7 days pt will be able to perform:  STG:  1.  Bed mobility:  Rolling L to R to L modified independent for positioning.  2.  Supine to sit to supine S with HR for meals.  3.  Sit to stand to sit S with RW in prep for ambulation.  LTG:  1.  Gait:  Ambulate >145ft S with RW, WBAT, for home/community mobility.  2.  Stair Negotiation:  Ascend/descend >10 steps CGA with HR for home entry.  3.  Activity tolerance:  Tolerate up in chair 1-2 hours for ADL's.  4.  Patient/Family Education:  Patient/family to be independent with HEP for follow-up care and safe discharge.   Outcome: Progressing Towards Goal   PHYSICAL THERAPY TREATMENT    Patient: Sierra Schmitt (48 y.o. female)  Date: 10/17/2018  Diagnosis: Osteoarthritis of left hip [M16.12]   <principal problem not specified>  Procedure(s) (LRB):  LEFT HIP:  TOTAL HIP REPLACEMENT ANTERIOR APPROACH W/C-ARM, " SPEC POP" (Left) 1 Day Post-Op  Precautions: Fall, WBAT   Chart, physical therapy assessment, plan of care and goals were reviewed.    ASSESSMENT:  Pt in bed upon arrival. Reviewed HEP per protocol. Pt expressed understanding. Deferred out of bed activities as pt c/o dizziness and about to start transfusion. Cont POC. Pt also had multiple questions about stair training and proper AD.      Progression toward goals:  []       Improving appropriately and progressing toward goals  [x]       Improving slowly and progressing toward goals  []       Not making progress toward goals and plan of care will be adjusted     PLAN:  Patient continues to benefit from skilled intervention to address the above impairments.  Continue treatment per established plan of care.  Discharge Recommendations:  Home Health  Further Equipment Recommendations for Discharge:  rolling walker     SUBJECTIVE:   Patient stated " I am dizzy. They are about to start a blood transfusion   "    OBJECTIVE DATA SUMMARY:   Critical Behavior:  Neurologic State: Alert  Orientation Level: Oriented X4  Cognition: Appropriate decision making, Appropriate for age attention/concentration, Appropriate safety awareness, Follows commands  Safety/Judgement: Awareness of environment, Home safety, Good awareness of safety precautions  Functional Mobility Training:    Therapeutic Exercises:       EXERCISE   Sets   Reps   Active Active Assist   Passive Self ROM   Comments   Ankle Pumps 1 10  [x]  []  []  []     Quad Sets/Glut Sets 1 10 [x]  []  []  []     Hamstring Sets   []  []  []  []     Short Arc Quads   []  []  []  []     Heel Slides 2 5 [x]  []  []  []     Straight Leg Raises   []  []  []  []     Hip Abd/Add   []  []  []  []     Long Arc Quads   []  []  []  []     Seated Marching   []  []  []  []     Standing Marching   []  []  []  []        []  []  []  []          Pain:  Pain Scale 1: Numeric (0 - 10)  Pain Intensity 1: 7  Pain Location  1: Hip  Pain Orientation 1: Left  Pain Description 1: Aching  Pain Intervention(s) 1: Medication (see MAR)  Activity Tolerance:   Fair     After treatment:   []  Patient left in no apparent distress sitting up in chair  [x]  Patient left in no apparent distress in bed  [x]  Call bell left within reach  []  Nursing notified  []  Caregiver present  []  Bed alarm activated      Westly Pam, PTA   Time Calculation: 23 mins

## 2018-10-17 NOTE — Progress Notes (Signed)
 Problem: Mobility Impaired (Adult and Pediatric)  Goal: *Acute Goals and Plan of Care (Insert Text)  Description: In 1-7 days pt will be able to perform:  STG:  1.  Bed mobility:  Rolling L to R to L modified independent for positioning.  2.  Supine to sit to supine S with HR for meals.  3.  Sit to stand to sit S with RW in prep for ambulation.  LTG:  1.  Gait:  Ambulate >172ft S with RW, WBAT, for home/community mobility.  2.  Stair Negotiation:  Ascend/descend >10 steps CGA with HR for home entry.  3.  Activity tolerance:  Tolerate up in chair 1-2 hours for ADL's.  4.  Patient/Family Education:  Patient/family to be independent with HEP for follow-up care and safe discharge.   10/17/2018 1453 by Jarvis Darice SAILOR, PTA  Outcome: Resolved/Met  10/17/2018 1356 by Jarvis Darice SAILOR, PTA  Outcome: Progressing Towards Goal   PHYSICAL THERAPY TREATMENT/DISCHARGE    Patient: Sierra Schmitt (48 y.o. female)  Date: 10/17/2018  Diagnosis: Osteoarthritis of left hip [M16.12]   <principal problem not specified>  Procedure(s) (LRB):  LEFT HIP:  TOTAL HIP REPLACEMENT ANTERIOR APPROACH W/C-ARM,  SPEC POP (Left) 1 Day Post-Op  Precautions: Fall, WBAT  Chart, physical therapy assessment, plan of care and goals were reviewed.    ASSESSMENT:  Pt meets needs for safe home mobility, expresses understanding of HEP and is cleared from this level of PT.     Progression toward goals:  [x]       Goals met  []       Improving appropriately and progressing toward goals  []       Improving slowly and progressing toward goals  []       Not making progress toward goals and plan of care will be adjusted     PLAN:  Patient will be discharged from physical therapy at this time.  Rationale for discharge:  [x]  Goals Achieved  []  Plateau Reached  []  Patient not participating in therapy  []  Other:  Discharge Recommendations:  Home Health  Further Equipment Recommendations for Discharge:  rolling walker     SUBJECTIVE:   Patient stated " I can go to my mom's  house if  I need to but she lives in [place]. "    OBJECTIVE DATA SUMMARY:   Critical Behavior:  Neurologic State: Alert  Orientation Level: Oriented X4  Cognition: Appropriate decision making, Appropriate for age attention/concentration, Appropriate safety awareness, Follows commands  Safety/Judgement: Awareness of environment, Home safety, Good awareness of safety precautions  Functional Mobility Training:  Bed Mobility:  Rolling: Modified independent  Supine to Sit: Supervision  Sit to Supine: Supervision  Scooting: Supervision    Transfers:  Sit to Stand: Supervision  Stand to Sit: Supervision    Balance:  Sitting: Intact  Standing: Intact;With support  Ambulation/Gait Training:  Distance (ft): 150 Feet (ft)  Assistive Device: Walker, rolling;Gait belt  Ambulation - Level of Assistance: Supervision  Gait Abnormalities: Antalgic;Decreased step clearance;Step to gait  Left Side Weight Bearing: As tolerated  Stance: Left decreased  Speed/Cadence: Slow  Step Length: Left shortened;Right shortened  Swing Pattern: Right asymmetrical;Left asymmetrical  Interventions: Verbal cues;Safety awareness training(R HHA)    Stairs:  Number of Stairs Trained: 20  Stairs - Level of Assistance: Contact guard assistance   Rail Use: Left     Pain:  Pain Scale 1: Numeric (0 - 10)  Pain Intensity 1: 7  Pain Location 1: Hip  Pain  Orientation 1: Left  Pain Description 1: Aching  Pain Intervention(s) 1: Medication (see MAR)  Activity Tolerance:   Fair+    After treatment:   [x]  Patient left in no apparent distress sitting up in chair  []  Patient left in no apparent distress in bed  [x]  Call bell left within reach  [x]  Nursing notified  []  Caregiver present  []  Bed alarm activated  Darice LOISE Pitt, PTA   Time Calculation: 25 mins

## 2018-10-17 NOTE — Progress Notes (Signed)
 Problem: Self Care Deficits Care Plan (Adult)  Goal: *Acute Goals and Plan of Care (Insert Text)  Description: Initial Occupational Therapy Goals (10/17/2018) Within 7 day(s):    1. Patient will perform grooming standing sinkside with SBA for increased independence in ADLs.  2. Patient will perform UB dressing with supervision seated EOB for increased independence with ADLs.  3. Patient will perform LB dressing with CGA & A/E PRN for increased independence with ADLs.  4. Patient will perform all aspects of toileting with SBA for increased independence with ADLs.  5. Patient will perform LB ADLs utilizing body mechanics & adaptive strategies with 1 verbal cue for increased safety in ADLs.  6. Patient will independently apply energy conservation techniques with 1 verbal cue(s)for increased independence with ADLs.     Outcome: Resolved/Met   OCCUPATIONAL THERAPY EVALUATION/DISCHARGE    Patient: Sierra Schmitt (48 y.o. female)  Date: 10/17/2018  Primary Diagnosis: Osteoarthritis of left hip [M16.12]  Procedure(s) (LRB):  LEFT HIP:  TOTAL HIP REPLACEMENT ANTERIOR APPROACH W/C-ARM,  SPEC POP (Left) 1 Day Post-Op   Precautions: Fall, WBAT  PLOF: pt mod I for ADLs, utilizing A/E for lower body dressing since previous R THR (about 3 weeks ago)    ASSESSMENT AND RECOMMENDATIONS:  Based on the objective data described below, the patient presents with LLE decreased ROM and strength affecting LE ADLs. Patient able to utilize sock aid for B sock donning, and required physical assist without. Pt had previously been using sock aid for dressing since she had her R THR. Pt complete bathroom mobility/toilet transfer with SBA. Recommended toilet safety frame to improve safety for toileting, pt reports she is planning to order one. Provided/educated on use of sock aid, reacher, long handled sponge to improve performance of ADLs due to recent R THR surgery and current L THR recovery. Pt able to don pants without assist using reacher.      Education: Reviewed home safety, body mechanics, importance of moving every hour to prevent joint stiffness, role of ice for edema/pain control, Rolling Walker management/safety, and adaptive dressing techniques with patient verbalizing  understanding at this time     Skilled Occupational Therapy is not indicated at this time in this setting. Patient should continue to improve as pain and ROM/strength improves.  Discharge Recommendations: Home Health  Further Equipment Recommendations for Discharge: toilet safety frame      SUBJECTIVE:   Patient stated "I had both of my hips done."    OBJECTIVE DATA SUMMARY:     Past Medical History:   Diagnosis Date    Arthritis     osteoarthirtis    Chronic pain     back and joints    Nicotine vapor product user      Past Surgical History:   Procedure Laterality Date    HX CHOLECYSTECTOMY  2002    HX GI  2005    Hemorrhoidectomy    HX HYSTERECTOMY  2018    HX LUMBAR LAMINECTOMY  2007    HX TUBAL LIGATION  1997    TOTAL HIP ARTHROPLASTY Right 09/2018     Barriers to Learning/Limitations: yes;  physical  Compensate with: visual, verbal, tactile, kinesthetic cues/model    Home Situation/Prior level of Function: pt mod I for ADLs, tub/shower with TTB, utilizing adaptive dressing aides  Home Situation  Home Environment: Apartment  # Steps to Enter: 32  Rails to Enter: Yes  Hand Rails : Right  One/Two Story Residence: One story  Living Alone: Yes  Support Systems: Friends \ neighbors  Patient Expects to be Discharged to:: Apartment  Current DME Used/Available at Home: Cane, straight, Environmental consultant, Tub transfer bench  Tub or Shower Type: Tub/Shower combination  []   Right hand dominant   []   Left hand dominant    Cognitive/Behavioral Status:  Neurologic State: Alert  Orientation Level: Oriented X4  Cognition: Appropriate decision making;Appropriate for age attention/concentration;Appropriate safety awareness;Follows commands  Safety/Judgement: Awareness of environment;Home safety;Good  awareness of safety precautions    Skin: L hip incision w/ Mepilex   Edema: compression hose in place & applied ice     Coordination:  Coordination: Within functional limits  Fine Motor Skills-Upper: Left Intact;Right Intact    Gross Motor Skills-Upper: Left Intact;Right Intact    Balance:  Sitting: Intact  Standing: Intact;With support    Strength: BUE  Strength: Generally decreased, functional    Tone & Sensation:BUE  Tone: Normal  Sensation: Intact    Range of Motion:BUE  AROM: Generally decreased, functional    Functional Mobility and Transfers for ADLs:  Bed Mobility:  Supine to Sit: Contact guard assistance  Sit to Supine: Contact guard assistance    Transfers:  Sit to Stand: Stand-by assistance   Toilet Transfer : Stand-by assistance    ADL Assessment/Intervention:  Feeding: Setup  Oral Facial Hygiene/Grooming: Setup  Bathing: Contact guard assistance  Upper Body Dressing: Setup  Lower Body Dressing: Contact guard assistance  Toileting: Stand by assistance     Upper Body Dressing Assistance  Dressing Assistance: Set-up  Pullover Shirt: Set-up  Lower Body Dressing Assistance  Dressing Assistance: Contact guard assistance  Pants With Elastic Waist: Contact guard assistance  Socks: Stand-by assistance  Position Performed: Seated edge of bed  Cues: Verbal cues provided  Adaptive Equipment Used: Reacher;Sock aid    LE Adaptive Equipment:  [x]  sock aid  [x]  reacher   [x]  long handle sponge  []  long handle shoe horn  []  leg lifter   was issued in order to maximize patient's independence for independent living/decrease caregiver burden/assist with co-morbidities affecting function and body mechanics.    Toileting  Toileting Assistance: Stand-by assistance  Bladder Hygiene: Supervision  Clothing Management: Stand-by assistance    Cognitive Retraining  Orientation Retraining: Awareness of environment  Problem Solving: Awareness of environment;General alternative solution;Deductive reason  Executive Functions: Executing  cognitive plans  Organizing/Sequencing: Breaking task down;Prioritizing;Two step sequence  Attention to Task: Single task  Maintains Attention For (Time): Greater than 10 minutes  Following Commands: Follows one step commands/directions  Safety/Judgement: Awareness of environment;Home safety;Good awareness of safety precautions    Pain:  Pain level pre-treatment: 6/10  Pain level post-treatment: 6/10  Pain Intervention(s): Medication administer by Nursing (see MAR); Rest, Ice, Repositioning   Response to intervention: Nurse notified, see doc flow sheet    Activity Tolerance:   Fair. Patient able to stand ~4 minute(s). Patient able to complete ADLs with intermittent rest breaks. Patient limited by pain, strength, ROM. Patient unsteady.     Please refer to the flowsheet for vital signs taken during this treatment.  After treatment:   []   Patient left in no apparent distress sitting up in chair  []   Patient sitting on EOB  [x]   Patient left in no apparent distress in bed  [x]   Call bell left within reach  [x]   Nursing notified  []   Caregiver present  [x]   Ice applied  [x]   SCD's on while back in bed    COMMUNICATION/EDUCATION:   Communication/Collaboration:  [x]   Role of Occupational Therapy in the acute care setting.  [x]       Home safety education was provided and the patient/caregiver indicated understanding.  [x]       Patient/family have participated as able in goal setting and plan of care.  [x]       Patient/family agree to work toward stated goals and plan of care.  []       Patient understands intent and goals of therapy, but is neutral about his/her participation.  []       Patient is unable to participate in plan of care at this time.    Thank you for this referral.  Levon Hagedorn, OTR/L  Time Calculation: 29 mins    Eval Complexity: History: MEDIUM Complexity : Expanded review of history including physical, cognitive and psychosocial  history ;   Examination: LOW Complexity : 1-3 performance deficits  relating to physical, cognitive , or psychosocial skils that result in activity limitations and / or participation restrictions ;   Decision Making:LOW Complexity : No comorbidities that affect functional and no verbal or physical assistance needed to complete eval tasks

## 2018-10-18 LAB — TYPE & SCREEN
ABO/Rh(D): O POS
Antibody screen: NEGATIVE
Status of unit: TRANSFUSED
Unit division: 0

## 2018-10-18 LAB — TYPE AND SCREEN
ABO/Rh: O POS
Antibody Screen: NEGATIVE
Status: TRANSFUSED
Unit Divison: 0

## 2019-04-05 ENCOUNTER — Ambulatory Visit: Payer: 59 | Admitting: Family Medicine

## 2019-04-10 ENCOUNTER — Ambulatory Visit (INDEPENDENT_AMBULATORY_CARE_PROVIDER_SITE_OTHER): Payer: PRIVATE HEALTH INSURANCE | Admitting: Family Medicine

## 2019-04-10 ENCOUNTER — Encounter: Payer: Self-pay | Admitting: Family Medicine

## 2019-04-10 ENCOUNTER — Other Ambulatory Visit: Payer: Self-pay

## 2019-04-10 VITALS — BP 149/88 | HR 76 | Temp 98.8°F | Ht 68.0 in | Wt 163.8 lb

## 2019-04-10 DIAGNOSIS — Z0000 Encounter for general adult medical examination without abnormal findings: Secondary | ICD-10-CM | POA: Diagnosis not present

## 2019-04-10 DIAGNOSIS — T148XXA Other injury of unspecified body region, initial encounter: Secondary | ICD-10-CM

## 2019-04-10 DIAGNOSIS — D649 Anemia, unspecified: Secondary | ICD-10-CM | POA: Insufficient documentation

## 2019-04-10 DIAGNOSIS — F419 Anxiety disorder, unspecified: Secondary | ICD-10-CM

## 2019-04-10 DIAGNOSIS — M533 Sacrococcygeal disorders, not elsewhere classified: Secondary | ICD-10-CM

## 2019-04-10 MED ORDER — BUSPIRONE HCL 5 MG PO TABS: 5.0000 mg | ORAL_TABLET | Freq: Three times a day (TID) | ORAL | 1 refills | Status: AC

## 2019-04-10 NOTE — Patient Instructions (Addendum)
Fasting labwork-[facility]  Start Buspar for anxiety Consider aqua therapy Ortho referral for evaluation hips and coccyx

## 2019-04-10 NOTE — Progress Notes (Signed)
New Patient Office Visit  Subjective:  Patient ID: Haley Joseph, female    DOB: 10/14/1970  Age: 48 y.o. MRN: [id number]  CC:  Chief Complaint  Patient presents with  . New Patient (Initial Visit)  . Hip Pain    bil hip surgery    HPI Haley Joseph presents for bilat hip surgry-follow up 10/16/18-completed at [facility] [place]-pt with insurance change and moved back to [place]-needs follow up post operative. No longer able to see prior physician due to insurance.  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 Now with bilat hip replacement. Pt able to sleep in the bed. Pt with popping with movement of left hip but not pain.  No laxity.  Pt ambulating without difficulty. NO trauma.  Pt with knee pain prior to hip replacements.  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 Leg lengthen problems caused early replacments.   3 weeks between bilat hip replacements  Anemia post operative-1 unit transfusion. No prior anemia. No blood in stool. No colon CA  Anxiety-took benzos, prozac and wellbutrin in the past-no current counseling. Pt states insurance causing this not to be available Pt self treated with alcohol in the past and used "xanax for 20 years"   Pain in the buttocks from fall. Fell down the steps hitting the buttocks to keep from hitting the hip Past Medical History:  Diagnosis Date  . Anxiety    takes for sleep-has ADHD  . Blood transfusion without reported diagnosis     Past Surgical History:  Procedure Laterality Date  . BACK SURGERY     2007-herniated disc  . CHOLECYSTECTOMY     2002  . COSMETIC SURGERY    . DILITATION & CURRETTAGE/HYSTROSCOPY WITH NOVASURE ABLATION N/A 12/23/2015   Procedure: DILATATION, HYSTEROSCOPY AND ENDOMETRIAL ABLATION;  Surgeon: Tilda BurrowJohn Ferguson V, MD;  Location: [facility];  Service: Gynecology;  Laterality: N/A;  . GALLBLADDER SURGERY     . HIP SURGERY Bilateral   . LAPAROSCOPIC HYSTERECTOMY    . TUBAL LIGATION     1999    Family History  Problem Relation Age of Onset  . Hypertension Mother   . Fibromyalgia Mother   . Mental illness Mother   . Diabetes Father   . 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 Marital status: Single    Spouse name: Not on file  . Number of children: Not on file  . Years of education: Not on file  . Highest education level: Not on file  Occupational History  . Not on file  Social Needs  . Financial resource strain: Not on file  . Food insecurity    Worry: Not on file    Inability: Not on file  . Transportation needs    Medical: Not on file    Non-medical: Not on file  Tobacco Use  . Smoking status: Former Smoker    Packs/day: 0.50    Years: 23.00    Pack years: 11.50    Quit date: 09/01/2008    Years since quitting: 10.6  .  Smokeless tobacco: Never Used  . Tobacco comment: Pt vapes   Substance and Sexual Activity  . Alcohol use: Yes    Comment: socially  . Drug use: No  . Sexual activity: Yes    Birth control/protection: Surgical  Lifestyle  . Physical activity    Days per week: Not on file    Minutes per session: Not on file  . Stress: Not on file  Relationships  . Social Herbalist on phone: Not on file    Gets together: Not on file    Attends religious service: Not on file    Active member of club or organization: Not on file    Attends meetings of clubs or organizations: Not on file    Relationship status: Not on file  . Intimate partner violence    Fear of current or ex partner: Not on file     Emotionally abused: Not on file    Physically abused: Not on file    Forced sexual activity: Not on file  Other Topics Concern  . Not on file  Social History Narrative  . Not on file    ROS Review of Systems  Musculoskeletal: Positive for arthralgias and back pain.       Coccyodynia   Psychiatric/Behavioral: Positive for agitation. The patient is nervous/anxious.     Objective:   Today's Vitals: BP (!) 149/88 (BP Location: Right Arm, Patient Position: Sitting, Cuff Size: Normal)   Pulse 76   Temp 98.8 F (37.1 C) (Oral)   Ht 5\' 8"  (1.727 m)   Wt 163 lb 12.8 oz (74.3 kg)   SpO2 98%   BMI 24.91 kg/m   Physical Exam Constitutional:      Appearance: Normal appearance.  HENT:     Head: Normocephalic and atraumatic.     Right Ear: Tympanic membrane, ear canal and external ear normal.     Left Ear: Tympanic membrane, ear canal and external ear normal.     Nose: Nose normal.     Mouth/Throat:     Mouth: Mucous membranes are moist.  Eyes:     Conjunctiva/sclera: Conjunctivae normal.  Neck:     Musculoskeletal: Normal range of motion.  Cardiovascular:     Rate and Rhythm: Normal rate and regular rhythm.     Pulses: Normal pulses.     Heart sounds: Normal heart sounds.  Pulmonary:     Effort: Pulmonary effort is normal.     Breath sounds: Normal breath sounds.  Abdominal:     General: Abdomen is flat.     Palpations: Abdomen is soft.  Skin:    Comments: Incisional site left hip-masses noted  Under incision  Ecchymosis -right buttocks-yellow/green  Neurological:     Mental Status: She is alert.     Assessment & Plan:   1. Coccydynia - Ambulatory referral to Orthopedic Surgery [facility] encouraged 2. Anxiety buspar-rx-new start-risk /benefit/side effect d/w pt Pt declined counseling referral Attends AA  3. Hematoma Heating pad to area-buttocks  4. Well adult exam Needs well woman exam - CBC with Differential - COMPLETE METABOLIC PANEL WITH GFR - TSH -  Lipid panel - MM Digital Screening; Future  5. Anemia, unspecified type cbc Follow-up:   2 weeks-new start buspar-may need increase dose Anne-Marie Genson Hannah Beat, MD

## 2019-04-11 ENCOUNTER — Ambulatory Visit: Payer: 59 | Admitting: Orthopaedic Surgery

## 2019-04-12 LAB — COMPLETE METABOLIC PANEL WITH GFR
AG Ratio: 1.7 (calc) (ref 1.0–2.5)
ALT: 10 U/L (ref 6–29)
AST: 20 U/L (ref 10–35)
Albumin: 4.3 g/dL (ref 3.6–5.1)
Alkaline phosphatase (APISO): 62 U/L (ref 31–125)
BUN: 11 mg/dL (ref 7–25)
CO2: 29 mmol/L (ref 20–32)
Calcium: 9.6 mg/dL (ref 8.6–10.2)
Chloride: 104 mmol/L (ref 98–110)
Creat: 0.66 mg/dL (ref 0.50–1.10)
GFR, Est African American: 123 mL/min/{1.73_m2} (ref >=60)
GFR, Est Non African American: 106 mL/min/{1.73_m2} (ref >=60)
Globulin: 2.6 g/dL (calc) (ref 1.9–3.7)
Glucose, Bld: 99 mg/dL (ref 65–99)
Potassium: 4.4 mmol/L (ref 3.5–5.3)
Sodium: 139 mmol/L (ref 135–146)
Total Bilirubin: 0.5 mg/dL (ref 0.2–1.2)
Total Protein: 6.9 g/dL (ref 6.1–8.1)

## 2019-04-12 LAB — CBC WITH DIFFERENTIAL/PLATELET
Absolute Monocytes: 240 cells/uL (ref 200–950)
Basophils Absolute: 9 cells/uL (ref 0–200)
Basophils Relative: 0.3 %
Eosinophils Absolute: 51 cells/uL (ref 15–500)
Eosinophils Relative: 1.7 %
HCT: 39.7 % (ref 35.0–45.0)
Hemoglobin: 12.5 g/dL (ref 11.7–15.5)
Lymphs Abs: 993 cells/uL (ref 850–3900)
MCH: 24.7 pg — ABNORMAL LOW (ref 27.0–33.0)
MCHC: 31.5 g/dL — ABNORMAL LOW (ref 32.0–36.0)
MCV: 78.5 fL — ABNORMAL LOW (ref 80.0–100.0)
MPV: 9.3 fL (ref 7.5–12.5)
Monocytes Relative: 8 %
Neutro Abs: 1707 cells/uL (ref 1500–7800)
Neutrophils Relative %: 56.9 %
Platelets: 287 10*3/uL (ref 140–400)
RBC: 5.06 10*6/uL (ref 3.80–5.10)
RDW: 15.8 % — ABNORMAL HIGH (ref 11.0–15.0)
Total Lymphocyte: 33.1 %
WBC: 3 10*3/uL — ABNORMAL LOW (ref 3.8–10.8)

## 2019-04-12 LAB — LIPID PANEL
Cholesterol: 314 mg/dL — ABNORMAL HIGH (ref <200)
HDL: 88 mg/dL (ref >=50)
LDL Cholesterol (Calc): 210 mg/dL (calc) — ABNORMAL HIGH
Non-HDL Cholesterol (Calc): 226 mg/dL (calc) — ABNORMAL HIGH (ref <130)
Total CHOL/HDL Ratio: 3.6 (calc) (ref <5.0)
Triglycerides: 55 mg/dL (ref <150)

## 2019-04-12 LAB — TSH: TSH: 1.08 mIU/L

## 2019-04-17 ENCOUNTER — Telehealth: Payer: Self-pay

## 2019-04-17 NOTE — Telephone Encounter (Signed)
Please call this patient today regarding her labs.  She has lost 65 lbs on Keto diet.  I have her scheduled for Orthopedic appt but she is worried about her labs.  Please call her cell # [phone]

## 2019-04-17 NOTE — Telephone Encounter (Signed)
WBC cell count slightly low  Anemia--low normal range Cholesterol elevated Repeat CBC in one month -appt to discuss cholesterol elevation and treatment options

## 2019-04-17 NOTE — Telephone Encounter (Signed)
Ms. Haberkorn is calling asking for lab results and asking about Ortho Referral which has been placed, patient states she spoke to Dr. Holly Bodily and was told to call back today, please advise?

## 2019-04-17 NOTE — Telephone Encounter (Signed)
Patient is aware of results and recommendations.

## 2019-04-18 ENCOUNTER — Other Ambulatory Visit: Payer: Self-pay

## 2019-04-18 ENCOUNTER — Encounter: Payer: Self-pay | Admitting: Family Medicine

## 2019-04-18 ENCOUNTER — Ambulatory Visit (INDEPENDENT_AMBULATORY_CARE_PROVIDER_SITE_OTHER): Payer: PRIVATE HEALTH INSURANCE | Admitting: Family Medicine

## 2019-04-18 VITALS — BP 111/77 | HR 57 | Temp 98.6°F | Ht 68.0 in | Wt 165.2 lb

## 2019-04-18 DIAGNOSIS — F419 Anxiety disorder, unspecified: Secondary | ICD-10-CM

## 2019-04-18 DIAGNOSIS — D72819 Decreased white blood cell count, unspecified: Secondary | ICD-10-CM

## 2019-04-18 DIAGNOSIS — Z23 Encounter for immunization: Secondary | ICD-10-CM | POA: Diagnosis not present

## 2019-04-18 DIAGNOSIS — E785 Hyperlipidemia, unspecified: Secondary | ICD-10-CM | POA: Diagnosis not present

## 2019-04-18 MED ORDER — PRAVASTATIN SODIUM 20 MG PO TABS: 20.0000 mg | ORAL_TABLET | Freq: Every day | ORAL | 2 refills | Status: AC

## 2019-04-18 NOTE — Patient Instructions (Addendum)
High Cholesterol pravachol 20mg    Trial of Fair life milk-skim  Recheck CBC in 1 month  Recheck lipid panel and liver test in 3 months  High cholesterol is a condition in which the blood has high levels of a white, waxy, fat-like substance (cholesterol). The human body needs small amounts of cholesterol. The liver makes all the cholesterol that the body needs. Extra (excess) cholesterol comes from the food that we eat. Cholesterol is carried from the liver by the blood through the blood vessels. If you have high cholesterol, deposits (plaques) may build up on the walls of your blood vessels (arteries). Plaques make the arteries narrower and stiffer. Cholesterol plaques increase your risk for heart attack and stroke. Work with your health care provider to keep your cholesterol levels in a healthy range. What increases the risk? This condition is more likely to develop in people who:  Eat foods that are high in animal fat (saturated fat) or cholesterol.  Are overweight.  Are not getting enough exercise.  Have a family history of high cholesterol. What are the signs or symptoms? There are no symptoms of this condition. How is this diagnosed? This condition may be diagnosed from the results of a blood test.  If you are older than age 20, your health care provider may check your cholesterol every 4-6 years.  You may be checked more often if you already have high cholesterol or other risk factors for heart disease. The blood test for cholesterol measures:  "Bad" cholesterol (LDL cholesterol). This is the main type of cholesterol that causes heart disease. The desired level for LDL is less than 100.  "Good" cholesterol (HDL cholesterol). This type helps to protect against heart disease by cleaning the arteries and carrying the LDL away. The desired level for HDL is 60 or higher.  Triglycerides. These are fats that the body can store or burn for energy. The desired number for triglycerides  is lower than 150.  Total cholesterol. This is a measure of the total amount of cholesterol in your blood, including LDL cholesterol, HDL cholesterol, and triglycerides. A healthy number is less than 200. How is this treated? This condition is treated with diet changes, lifestyle changes, and medicines. Diet changes  This may include eating more whole grains, fruits, vegetables, nuts, and fish.  This may also include cutting back on red meat and foods that have a lot of added sugar. Lifestyle changes  Changes may include getting at least 40 minutes of aerobic exercise 3 times a week. Aerobic exercises include walking, biking, and swimming. Aerobic exercise along with a healthy diet can help you maintain a healthy weight.  Changes may also include quitting smoking. Medicines  Medicines are usually given if diet and lifestyle changes have failed to reduce your cholesterol to healthy levels.  Your health care provider may prescribe a statin medicine. Statin medicines have been shown to reduce cholesterol, which can reduce the risk of heart disease. Follow these instructions at home: Eating and drinking If told by your health care provider:  Eat chicken (without skin), fish, veal, shellfish, ground [place] breast, and round or loin cuts of red meat.  Do not eat fried foods or fatty meats, such as hot dogs and salami.  Eat plenty of fruits, such as apples.  Eat plenty of vegetables, such as broccoli, potatoes, and carrots.  Eat beans, peas, and lentils.  Eat grains such as barley, rice, couscous, and bulgur wheat.  Eat pasta without cream sauces.  Use  skim or nonfat milk, and eat low-fat or nonfat yogurt and cheeses.  Do not eat or drink whole milk, cream, ice cream, egg yolks, or hard cheeses.  Do not eat stick margarine or tub margarines that contain trans fats (also called partially hydrogenated oils).  Do not eat saturated tropical oils, such as coconut oil and palm oil.   Do not eat cakes, cookies, crackers, or other baked goods that contain trans fats.  General instructions  Exercise as directed by your health care provider. Increase your activity level with activities such as gardening, walking, and taking the stairs.  Take over-the-counter and prescription medicines only as told by your health care provider.  Do not use any products that contain nicotine or tobacco, such as cigarettes and e-cigarettes. If you need help quitting, ask your health care provider.  Keep all follow-up visits as told by your health care provider. This is important. Contact a health care provider if:  You are struggling to maintain a healthy diet or weight.  You need help to start on an exercise program.  You need help to stop smoking. Get help right away if:  You have chest pain.  You have trouble breathing. This information is not intended to replace advice given to you by your health care provider. Make sure you discuss any questions you have with your health care provider. Document Released: 07/19/2005 Document Revised: 07/22/2017 Document Reviewed: 01/17/2016 Elsevier Patient Education  2020 [employer]

## 2019-04-18 NOTE — Progress Notes (Signed)
Established Patient Office Visit  Subjective:  Patient ID: Haley Joseph, female    DOB: 12/04/1970  Age: 48 y.o. MRN: [id number]  CC:  Chief Complaint  Patient presents with  . Follow-up  . Hyperlipidemia    HPI Haley Citronngela M Joseph presents for hyperlipidemia-elevated on labwork Low WBC-no h/o of acute illness, fatigue  Past Medical History:  Diagnosis Date  . Anxiety    takes for sleep-has ADHD  . Blood transfusion without reported diagnosis     Past Surgical History:  Procedure Laterality Date  . BACK SURGERY     2007-herniated disc  . CHOLECYSTECTOMY     2002  . COSMETIC SURGERY    . DILITATION & CURRETTAGE/HYSTROSCOPY WITH NOVASURE ABLATION N/A 12/23/2015   Procedure: DILATATION, HYSTEROSCOPY AND ENDOMETRIAL ABLATION;  Surgeon: Tilda BurrowJohn Ferguson V, MD;  Location: [facility];  Service: Gynecology;  Laterality: N/A;  . GALLBLADDER SURGERY    . HIP SURGERY Bilateral   . LAPAROSCOPIC HYSTERECTOMY    . TUBAL LIGATION     1999    Family History  Problem Relation Age of Onset  . Hypertension Mother   . Fibromyalgia Mother   . Mental illness Mother   . Diabetes Father   . Hypertension Father     Social History   Socioeconomic History  . Marital status: Single    Spouse name: Not on file  . Number of children: Not on file  . Years of education: Not on file  . Highest education level: Not on file  Occupational History  . Not on file  Social Needs  . Financial resource strain: Not on file  . Food insecurity    Worry: Not on file    Inability: Not on file  . Transportation needs    Medical: Not on file    Non-medical: Not on file  Tobacco Use  . Smoking status: Former Smoker    Packs/day: 0.50    Years: 23.00    Pack years: 11.50    Quit date: 09/01/2008    Years since quitting: 10.6  . Smokeless tobacco: Never Used  . Tobacco comment: Pt vapes   Substance and Sexual Activity  . Alcohol use: Yes    Comment: socially  . Drug use: No  . Sexual activity: Yes     Birth control/protection: Surgical  Lifestyle  . Physical activity    Days per week: Not on file    Minutes per session: Not on file  . Stress: Not on file  Relationships  . Social Musicianconnections    Talks on phone: Not on file    Gets together: Not on file    Attends religious service: Not on file    Active member of club or organization: Not on file    Attends meetings of clubs or organizations: Not on file    Relationship status: Not on file  . Intimate partner violence    Fear of current or ex partner: Not on file    Emotionally abused: Not on file    Physically abused: Not on file    Forced sexual activity: Not on file  Other Topics Concern  . Not on file  Social History Narrative  . Not on file    Outpatient Medications Prior to Visit  Medication Sig Dispense Refill  . busPIRone (BUSPAR) 5 MG tablet Take 1 tablet (5 mg total) by mouth 3 (three) times daily. 90 tablet 1   No facility-administered medications prior to visit.  No Known Allergies  ROS Review of Systems  Constitutional: Positive for chills and fatigue.       Night sweats  Weight loss 65 lbs -6 months-Keto diet  HENT: Negative for congestion.        Recent jaw surgery with lip-suction  Respiratory: Positive for chest tightness. Negative for cough and shortness of breath.        Chest tightness with anxiety  Cardiovascular: Negative for chest pain.  Gastrointestinal: Positive for constipation.       Stool softner  Endocrine: Negative for cold intolerance, heat intolerance, polydipsia, polyphagia and polyuria.  Genitourinary: Negative for dysuria and frequency.  Musculoskeletal:       Keto after hip surgery 2/20 and 3/20  Skin: Negative for rash.  Allergic/Immunologic: Negative for environmental allergies, food allergies and immunocompromised state.  Neurological: Positive for headaches. Negative for syncope and weakness.       Back of the head-lighting strike  Psychiatric/Behavioral: The  patient is nervous/anxious and is hyperactive.       Objective:    Physical Exam  Constitutional: She is oriented to person, place, and time. She appears well-developed and well-nourished. No distress.  HENT:  Head: Normocephalic and atraumatic.  Eyes: Conjunctivae are normal.  Neck: Neck supple.  Cardiovascular: Normal rate and regular rhythm.  Pulmonary/Chest: Effort normal and breath sounds normal.  Neurological: She is alert and oriented to person, place, and time.  Psychiatric: She has a normal mood and affect. Her behavior is normal.    BP 111/77 (BP Location: Left Arm, Patient Position: Sitting, Cuff Size: Normal)   Pulse (!) 57   Temp 98.6 F (37 C) (Oral)   Ht 5\' 8"  (1.727 m)   Wt 165 lb 3.2 oz (74.9 kg)   SpO2 99%   BMI 25.12 kg/m  Wt Readings from Last 3 Encounters:  04/18/19 165 lb 3.2 oz (74.9 kg)  04/10/19 163 lb 12.8 oz (74.3 kg)  02/02/16 161 lb 8 oz (73.3 kg)     Health Maintenance Due  Topic Date Due  . HIV Screening  05/09/1987  . TETANUS/TDAP  05/09/1991  . PAP SMEAR-Modifier  02/08/2014  . INFLUENZA VACCINE  03/03/2019     Lab Results  Component Value Date   TSH 1.08 04/11/2019   Lab Results  Component Value Date   WBC 3.0 (L) 04/11/2019   HGB 12.5 04/11/2019   HCT 39.7 04/11/2019   MCV 78.5 (L) 04/11/2019   PLT 287 04/11/2019   Lab Results  Component Value Date   NA 139 04/11/2019   K 4.4 04/11/2019   CO2 29 04/11/2019   GLUCOSE 99 04/11/2019   BUN 11 04/11/2019   CREATININE 0.66 04/11/2019   BILITOT 0.5 04/11/2019   ALKPHOS 59 12/22/2015   AST 20 04/11/2019   ALT 10 04/11/2019   PROT 6.9 04/11/2019   ALBUMIN 3.8 12/22/2015   CALCIUM 9.6 04/11/2019   ANIONGAP 6 12/22/2015   Lab Results  Component Value Date   CHOL 314 (H) 04/11/2019   Lab Results  Component Value Date   HDL 88 04/11/2019   Lab Results  Component Value Date   LDLCALC 210 (H) 04/11/2019   Lab Results  Component Value Date   TRIG 55 04/11/2019    Lab Results  Component Value Date   CHOLHDL 3.6 04/11/2019      Assessment & Plan:   1. Leukopenia, unspecified type Repeat in 1 month - CBC with Differential  2. Anxiety buspar  3. Hyperlipidemia,  unspecified hyperlipidemia type  pravachol Follow-up: 3 months-recheck lipid panel and liver function CBC 1 month to recheck  Faithe Ariola Hannah Beat, MD

## 2019-04-25 ENCOUNTER — Encounter: Payer: Self-pay | Admitting: Orthopaedic Surgery

## 2019-04-25 ENCOUNTER — Ambulatory Visit (INDEPENDENT_AMBULATORY_CARE_PROVIDER_SITE_OTHER): Payer: PRIVATE HEALTH INSURANCE | Admitting: Orthopaedic Surgery

## 2019-04-25 DIAGNOSIS — M533 Sacrococcygeal disorders, not elsewhere classified: Secondary | ICD-10-CM

## 2019-04-25 DIAGNOSIS — Z23 Encounter for immunization: Secondary | ICD-10-CM | POA: Insufficient documentation

## 2019-04-25 NOTE — Progress Notes (Deleted)
   Office Visit Note   Patient: Haley Joseph           Date of Birth: 1970-11-02           MRN: [id number] Visit Date: 04/25/2019              Requested by: Maryruth Hancock, MD [address] [place],  [place] [address] PCP: Maryruth Hancock, MD   Assessment & Plan: Visit Diagnoses: No diagnosis found.  Plan: ***  Follow-Up Instructions: No follow-ups on file.   Orders:  No orders of the defined types were placed in this encounter.  No orders of the defined types were placed in this encounter.     Procedures: No procedures performed   Clinical Data: No additional findings.   Subjective: No chief complaint on file.   HPI  Review of Systems   Objective: Vital Signs: There were no vitals taken for this visit.  Physical Exam  Ortho Exam  Specialty Comments:  No specialty comments available.  Imaging: No results found.   PMFS History: Patient Active Problem List   Diagnosis Date Noted  . Leukopenia 04/18/2019  . Hyperlipidemia 04/18/2019  . Coccydynia 04/10/2019  . Anxiety 04/10/2019  . Hematoma 04/10/2019  . Well adult exam 04/10/2019  . Anemia 04/10/2019  . Postop check 02/03/2016   Past Medical History:  Diagnosis Date  . Anxiety    takes for sleep-has ADHD  . Blood transfusion without reported diagnosis     Family History  Problem Relation Age of Onset  . Hypertension Mother   . Fibromyalgia Mother   . Mental illness Mother   . Diabetes Father   . Hypertension Father     Past Surgical History:  Procedure Laterality Date  . BACK SURGERY     2007-herniated disc  . CHOLECYSTECTOMY     2002  . COSMETIC SURGERY    . DILITATION & CURRETTAGE/HYSTROSCOPY WITH NOVASURE ABLATION N/A 12/23/2015   Procedure: DILATATION, HYSTEROSCOPY AND ENDOMETRIAL ABLATION;  Surgeon: Jonnie Kind, MD;  Location: [facility];  Service: Gynecology;  Laterality: N/A;  . GALLBLADDER SURGERY    . HIP SURGERY Bilateral   . LAPAROSCOPIC HYSTERECTOMY    . TUBAL LIGATION      1999   Social History   Occupational History  . Not on file  Tobacco Use  . Smoking status: Former Smoker    Packs/day: 0.50    Years: 23.00    Pack years: 11.50    Quit date: 09/01/2008    Years since quitting: 10.6  . Smokeless tobacco: Never Used  . Tobacco comment: Pt vapes   Substance and Sexual Activity  . Alcohol use: Yes    Comment: socially  . Drug use: No  . Sexual activity: Yes    Birth control/protection: Surgical

## 2019-04-25 NOTE — Progress Notes (Signed)
The patient has a history of chronic coccyx pain.  She has also a history of bilateral total hip arthroplasties that were done in [place] in February and then March of this year.  This did not resolve her coccyx pain.  This is been a chronic issue for her.  She rode in an RV in a recliner for many years and then put pressure on her coccyx.  She has had 1 injection in that area but it was just a trigger point injection and not under any type of ultrasound guidance.  She has had no other imaging studies other than plain films that were unremarkable.  I did let her know that she does need some type of advanced imaging study to assess her coccyx and to see whether or not an ultrasound-guided injection would be helpful.  With that being said, I will refer her to an appointment next week to my partner Dr. Junius Roads to see if he can assess her coccyx area under ultrasound and provide her with a diagnostic and therapeutic injection.  We will work on getting that appointment set up and she is happy to do so.

## 2019-04-26 ENCOUNTER — Other Ambulatory Visit: Payer: Self-pay

## 2019-04-26 ENCOUNTER — Ambulatory Visit (HOSPITAL_COMMUNITY)
Admission: RE | Admit: 2019-04-26 | Discharge: 2019-04-26 | Disposition: A | Discharge status: Home / Self Care | Payer: PRIVATE HEALTH INSURANCE | Source: Ambulatory Visit | Attending: Family Medicine | Admitting: Family Medicine

## 2019-04-26 DIAGNOSIS — Z1231 Encounter for screening mammogram for malignant neoplasm of breast: Secondary | ICD-10-CM | POA: Diagnosis not present

## 2019-04-26 DIAGNOSIS — Z0000 Encounter for general adult medical examination without abnormal findings: Secondary | ICD-10-CM

## 2019-04-30 ENCOUNTER — Ambulatory Visit (INDEPENDENT_AMBULATORY_CARE_PROVIDER_SITE_OTHER): Payer: PRIVATE HEALTH INSURANCE | Admitting: Family Medicine

## 2019-04-30 ENCOUNTER — Ambulatory Visit: Payer: PRIVATE HEALTH INSURANCE | Admitting: Family Medicine

## 2019-04-30 ENCOUNTER — Encounter: Payer: Self-pay | Admitting: Family Medicine

## 2019-04-30 DIAGNOSIS — M533 Sacrococcygeal disorders, not elsewhere classified: Secondary | ICD-10-CM

## 2019-04-30 NOTE — Progress Notes (Signed)
Subjective: She is here for a planned ultrasound-guided coccyx injection.  2 years of pain possibly from prolonged sitting when she was living in an RV and sleeping in a chair.  Objective: Point tender at the tip of the coccyx.  Procedure: Ultrasound-guided injection: Area of point tenderness was visualized, it is at the tip of the coccyx and there appears to be a slight amount of swelling but no hyperemia on power Doppler imaging.  After sterile prep with Betadine, injected 3 cc 1% lidocaine without epinephrine then 40 mg methylprednisolone using ultrasound to guide needle placement at the tip of the coccyx.  Within 5 minutes she had very good pain relief.  She will contact me if symptoms persist, we will then order MRI sacrum and coccyx as well as MRI lumbar spine to look for sacral nerve impingement.

## 2019-05-15 ENCOUNTER — Ambulatory Visit: Payer: PRIVATE HEALTH INSURANCE | Admitting: Family Medicine

## 2019-05-15 ENCOUNTER — Telehealth: Payer: Self-pay | Admitting: Family Medicine

## 2019-05-15 NOTE — Telephone Encounter (Signed)
Patient is calling back and states she would like to keep Dr. Holly Bodily as her primary care doctor and she is switching her insurance to something in [place] but will still be a [place] resident and would like Dr. Holly Bodily to call her back.

## 2019-05-15 NOTE — Telephone Encounter (Signed)
Routing to [facility] & Dr. Holly Bodily for Advice?

## 2019-05-15 NOTE — Telephone Encounter (Signed)
Patient called in with concerns about receiving medical bills from her insurance [employer]. She states she has received a bill for every visit with Dr. Holly Bodily. I informed her as far as I knew we were still in network she could check with her insurance company. Patient states she just got off the phone with her insurance company and they told her they show she is in network but her bills say she is not in network and Dr. Holly Bodily could of just fell off and them not know. Patient was not happy that billing was not handled in the office I gave the pt the billing number number. She called back and said it was a recording that they were in a meeting. She canceled her appointment and said healthcare is just a money racket. Patient was not pleasant and hung up.

## 2019-05-16 NOTE — Telephone Encounter (Signed)
Haley Joseph aware and will proceed with discharge

## 2019-05-17 ENCOUNTER — Ambulatory Visit: Payer: PRIVATE HEALTH INSURANCE | Admitting: Family Medicine

## 2019-05-22 NOTE — Telephone Encounter (Signed)
Received patient letter in return mail. Unable to deliver.

## 2021-04-21 IMAGING — MG MM DIGITAL SCREENING BILAT W/ TOMO W/ CAD
8 series · 9 of 24 positions shown · non-contrast
Comparison: None.

CLINICAL DATA: Screening.

EXAM:
DIGITAL SCREENING BILATERAL MAMMOGRAM WITH TOMO AND CAD

[R MLO synth-2D]
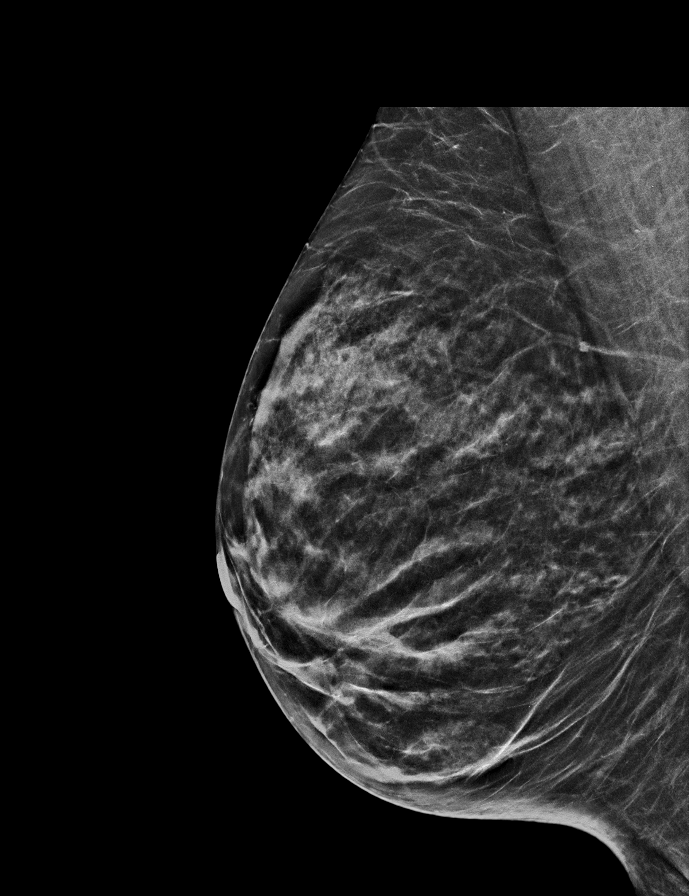

[L MLO synth-2D]
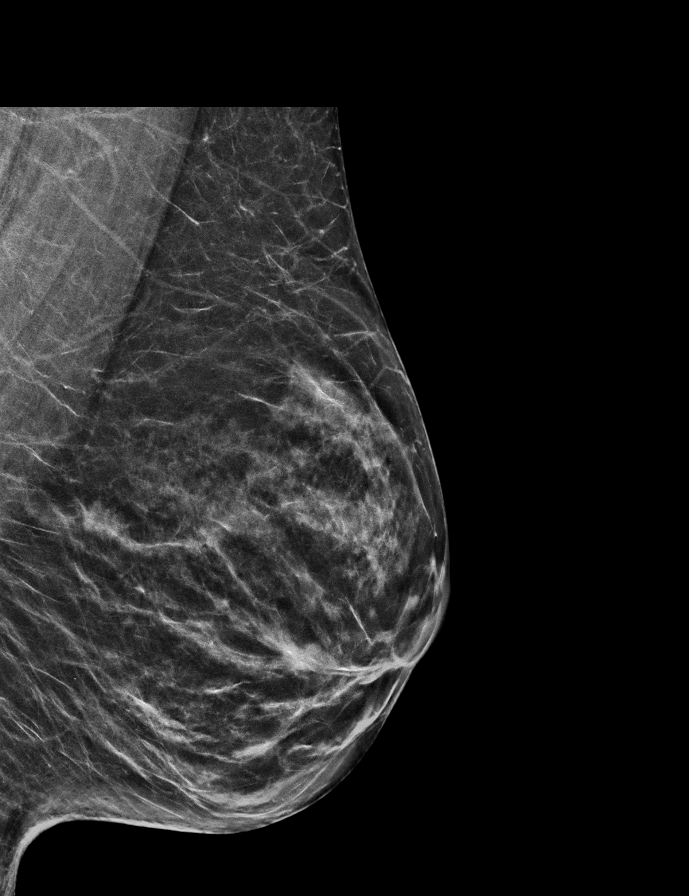

[L CC synth-2D]
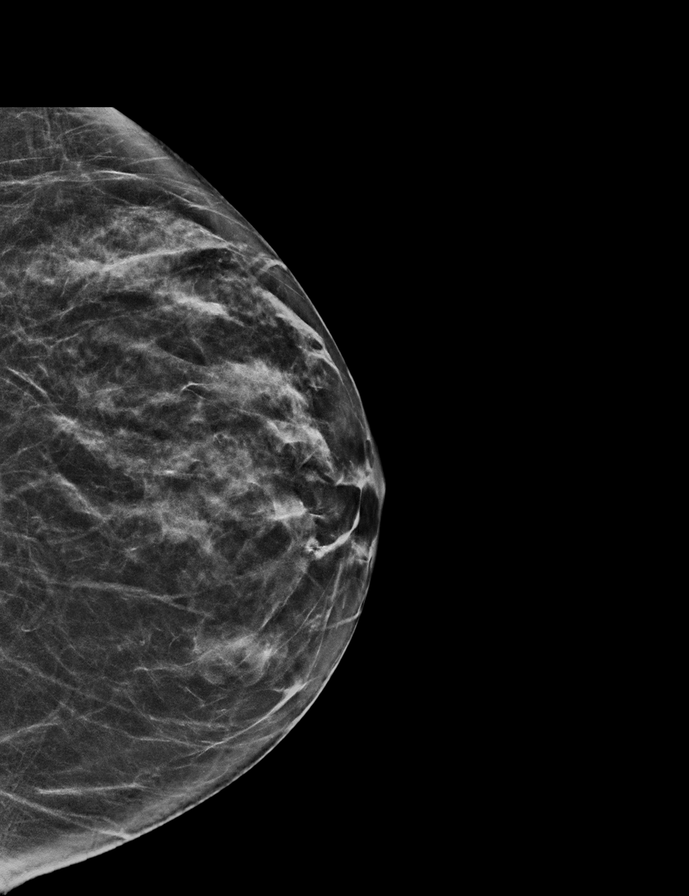

[R CC synth-2D]
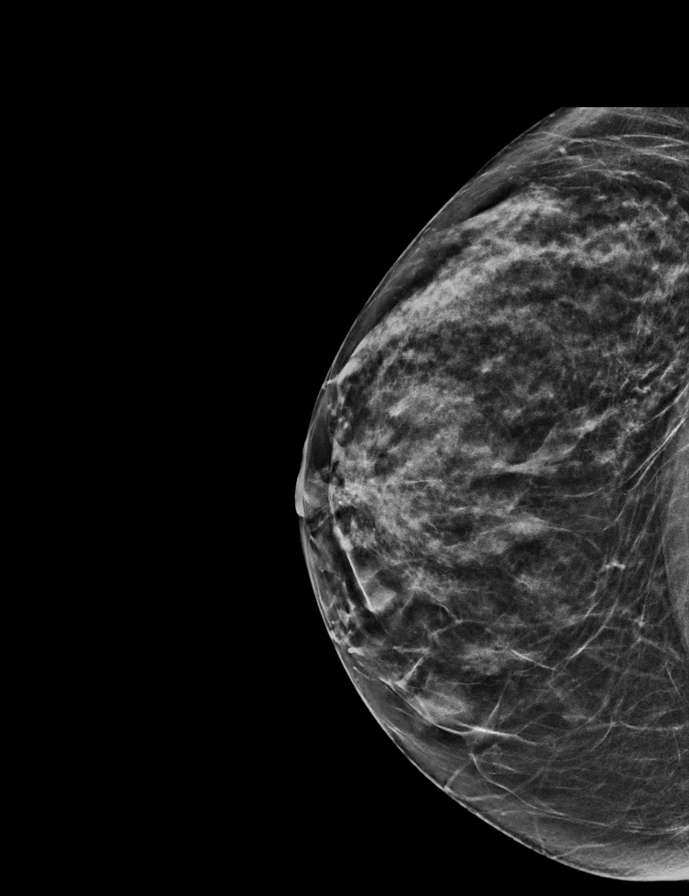

[L CC tomo · 2 of 56 frames shown]
[frame 19/56]
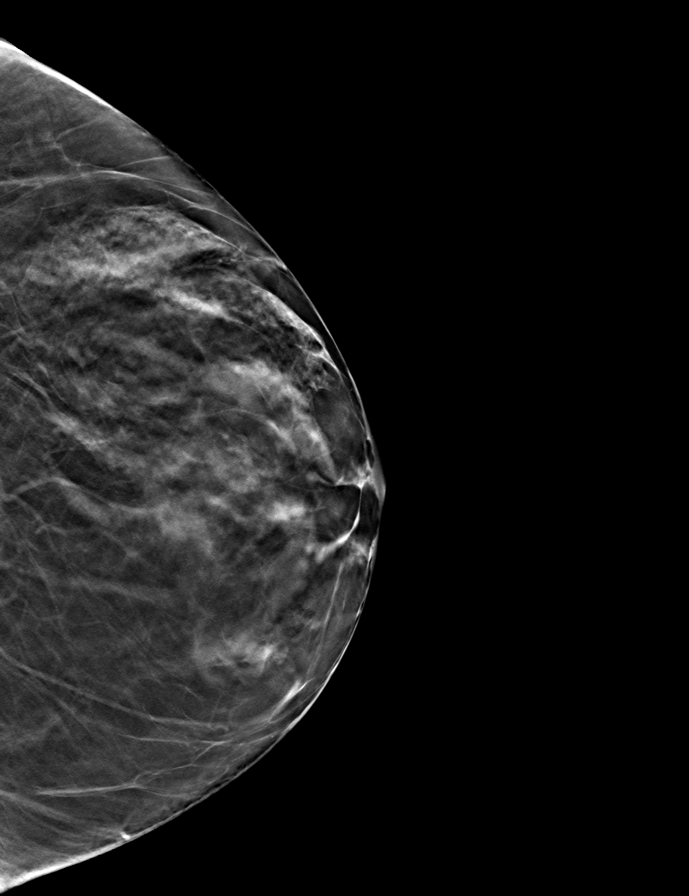
[frame 29/56]
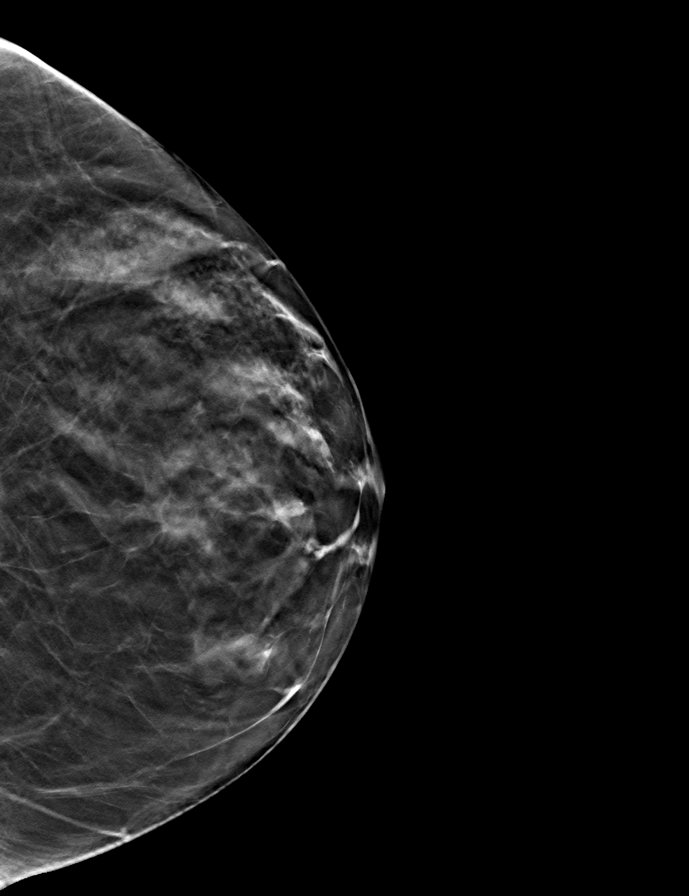

[L MLO tomo · tomo slice 29/57.0]
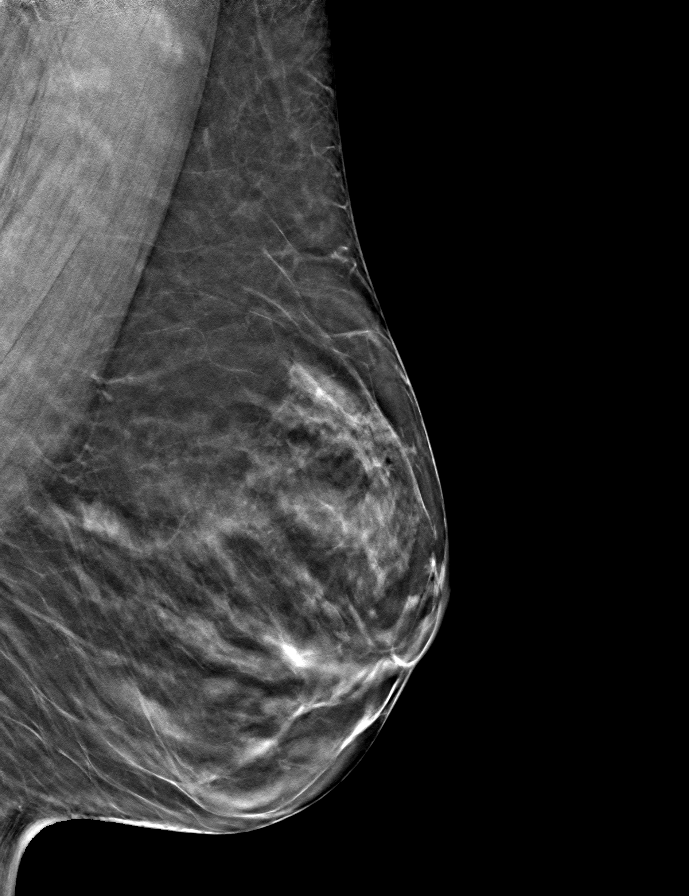

[R CC tomo · tomo slice 29/57.0]
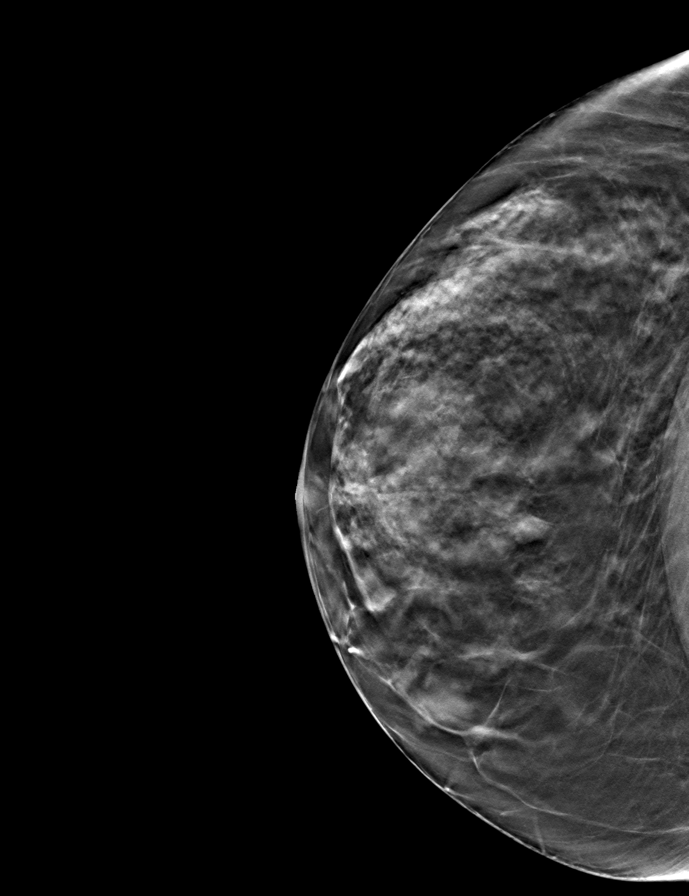

[R MLO tomo · tomo slice 27/54.0]
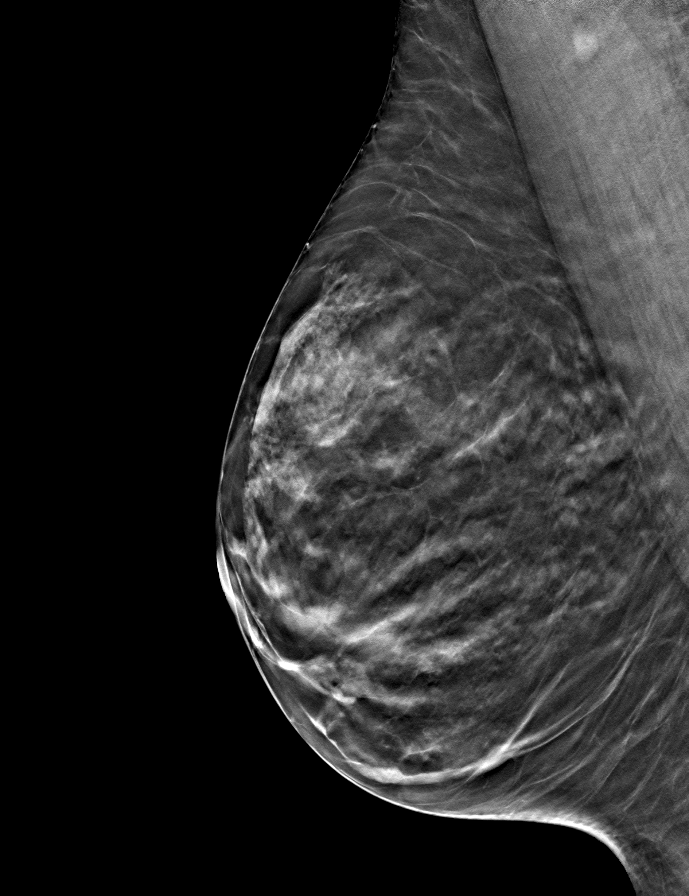

[9 of 24 positions shown; findings below may reference images not displayed]

ACR Breast Density Category c: The breast tissue is heterogeneously
dense, which may obscure small masses
FINDINGS: There are no findings suspicious for malignancy. Images were
processed with CAD.
IMPRESSION: No mammographic evidence of malignancy. A result letter of this
screening mammogram will be mailed directly to the patient.

RECOMMENDATION:
Screening mammogram in one year. (Code:EM-2-IHY)

BI-RADS CATEGORY  1: Negative.

## 2024-07-12 ENCOUNTER — Ambulatory Visit: Payer: Self-pay | Admitting: *Deleted

## 2024-07-12 NOTE — Telephone Encounter (Signed)
 FYI Only or Action Required?: FYI only for provider: ED advised and unsure patient will go .  Patient was last seen in primary care on na / no PCP.  Called Nurse Triage reporting Facial Pain. Sinus pain, chest heaviness, SOB at rest   Symptoms began a week ago.  Interventions attempted: Other: unsure .  Symptoms are: gradually worsening.  Triage Disposition: Go to ED Now (or PCP Triage)  Patient/caregiver understands and will follow disposition?: No, refuses disposition   Recommended ED due to sx and hx PE. Unsure if patient will go . Patient does not have PCP. Patient requesting new patient appt. Scheduled with [facility] 10/26/24. Patient reports she needs medication refills and lab work. Placed on wait list for earlier new patient appt. See NT encounter           Copied from CRM #[id number]. Topic: Clinical - Red Word Triage >> Jul 12, 2024  9:54 AM Wess RAMAN wrote: Red Word that prompted transfer to Nurse Triage: Headaches, swelling in face, drainage, believes she has a sinus infection, stress and anxiety  Would like new patient appt with Grooms, Charmaine, PA-C at [facility] Reason for Disposition  Patient sounds very sick or weak to the triagerBoth  Answer Assessment - Initial Assessment Questions Instructed to go to ED now for evaluation . Patient reports she is only calling to establish care with a dr. Patient requesting medication refills and has just moved to [place] [employer] and only works in the area on Fridays. Patient upset she can not get seen earlier and can not get in touch with office only and not sent to triage nurses. Explained to patient rationale for triage due to report of pain and sx of chest heaviness and SOB. Contacted [facility] to request if earlier appt can be scheduled and patient on waitlist.       1. ONSET: When did the pain start? (e.g., minutes, hours, days)     1 week  2. ONSET: Does the pain come and go, or has it been constant since it  started? (e.g., constant, intermittent, fleeting)     Constant  3. SEVERITY: How bad is the pain? (Scale 1-10; mild, moderate or severe)     Moderate to severe  4. LOCATION: Where does it hurt?      Nose, eyes  5. RASH: Is there any redness, rash, or swelling of your face?     No  6. FEVER: Do you have a fever? If Yes, ask: What is it, how was it measured, and when did it start?      No  7. OTHER SYMPTOMS: Do you have any other symptoms? (e.g., fever, toothache, nasal discharge, nasal congestion, clicking sensation in jaw joint)     Headaches, sinus pain nasal pressure no runny nose, feels heavy chest , SOB with exertion and at rest . Hx PE 8. PREGNANCY: Is there any chance you are pregnant? When was your last menstrual period?     na  Protocols used: Face Pain-A-AH

## 2024-10-26 ENCOUNTER — Ambulatory Visit: Admitting: Physician Assistant
